# Patient Record
Sex: Female | Born: 2000 | Race: White | Hispanic: No | Marital: Single | Smoking: Never smoker
Health system: Southern US, Community
[De-identification: ages and names within clinical notes are randomized; demographics above are authoritative.]

## PROBLEM LIST (undated history)

## (undated) DIAGNOSIS — F419 Anxiety disorder, unspecified: Secondary | ICD-10-CM

## (undated) DIAGNOSIS — Z789 Other specified health status: Secondary | ICD-10-CM

## (undated) HISTORY — PX: NO PAST SURGERIES: SHX2092

---

## 2019-09-27 DIAGNOSIS — O009 Unspecified ectopic pregnancy without intrauterine pregnancy: Secondary | ICD-10-CM

## 2019-09-27 HISTORY — DX: Unspecified ectopic pregnancy without intrauterine pregnancy: O00.90

## 2020-07-11 ENCOUNTER — Inpatient Hospital Stay (HOSPITAL_COMMUNITY)
Admission: AD | Admit: 2020-07-11 | Discharge: 2020-07-11 | Disposition: A | Discharge status: Home / Self Care | Payer: Medicaid Other | Attending: Family Medicine | Admitting: Family Medicine

## 2020-07-11 ENCOUNTER — Inpatient Hospital Stay (HOSPITAL_COMMUNITY): Payer: Medicaid Other

## 2020-07-11 ENCOUNTER — Encounter (HOSPITAL_COMMUNITY): Payer: Self-pay | Admitting: Family Medicine

## 2020-07-11 ENCOUNTER — Other Ambulatory Visit: Payer: Self-pay

## 2020-07-11 DIAGNOSIS — R300 Dysuria: Secondary | ICD-10-CM | POA: Insufficient documentation

## 2020-07-11 DIAGNOSIS — O009 Unspecified ectopic pregnancy without intrauterine pregnancy: Secondary | ICD-10-CM | POA: Diagnosis not present

## 2020-07-11 DIAGNOSIS — O26891 Other specified pregnancy related conditions, first trimester: Secondary | ICD-10-CM | POA: Insufficient documentation

## 2020-07-11 DIAGNOSIS — Z3A01 Less than 8 weeks gestation of pregnancy: Secondary | ICD-10-CM | POA: Diagnosis not present

## 2020-07-11 DIAGNOSIS — O209 Hemorrhage in early pregnancy, unspecified: Secondary | ICD-10-CM

## 2020-07-11 HISTORY — DX: Other specified health status: Z78.9

## 2020-07-11 LAB — COMPREHENSIVE METABOLIC PANEL
ALT: 31 U/L (ref 0–44)
AST: 28 U/L (ref 15–41)
Albumin: 4.1 g/dL (ref 3.5–5.0)
Alkaline Phosphatase: 86 U/L (ref 38–126)
Anion gap: 10 (ref 5–15)
BUN: 12 mg/dL (ref 6–20)
CO2: 20 mmol/L — ABNORMAL LOW (ref 22–32)
Calcium: 9.3 mg/dL (ref 8.9–10.3)
Chloride: 108 mmol/L (ref 98–111)
Creatinine, Ser: 0.84 mg/dL (ref 0.44–1.00)
GFR, Estimated: >60 mL/min (ref >60)
Glucose, Bld: 82 mg/dL (ref 70–99)
Potassium: 3.6 mmol/L (ref 3.5–5.1)
Sodium: 138 mmol/L (ref 135–145)
Total Bilirubin: 0.9 mg/dL (ref 0.3–1.2)
Total Protein: 7.3 g/dL (ref 6.5–8.1)

## 2020-07-11 LAB — WET PREP, GENITAL
Clue Cells Wet Prep HPF POC: NONE SEEN
Sperm: NONE SEEN
Trich, Wet Prep: NONE SEEN
Yeast Wet Prep HPF POC: NONE SEEN

## 2020-07-11 LAB — CBC WITH DIFFERENTIAL/PLATELET
Abs Immature Granulocytes: 0.03 10*3/uL (ref 0.00–0.07)
Basophils Absolute: 0 10*3/uL (ref 0.0–0.1)
Basophils Relative: 0 %
Eosinophils Absolute: 0.1 10*3/uL (ref 0.0–0.5)
Eosinophils Relative: 1 %
HCT: 44 % (ref 36.0–46.0)
Hemoglobin: 14.6 g/dL (ref 12.0–15.0)
Immature Granulocytes: 0 %
Lymphocytes Relative: 17 %
Lymphs Abs: 1.7 10*3/uL (ref 0.7–4.0)
MCH: 29.1 pg (ref 26.0–34.0)
MCHC: 33.2 g/dL (ref 30.0–36.0)
MCV: 87.8 fL (ref 80.0–100.0)
Monocytes Absolute: 0.8 10*3/uL (ref 0.1–1.0)
Monocytes Relative: 8 %
Neutro Abs: 7.4 10*3/uL (ref 1.7–7.7)
Neutrophils Relative %: 74 %
Platelets: 201 10*3/uL (ref 150–400)
RBC: 5.01 MIL/uL (ref 3.87–5.11)
RDW: 13 % (ref 11.5–15.5)
WBC: 9.9 10*3/uL (ref 4.0–10.5)
nRBC: 0 % (ref 0.0–0.2)

## 2020-07-11 LAB — CBC
HCT: 43.1 % (ref 36.0–46.0)
Hemoglobin: 14.5 g/dL (ref 12.0–15.0)
MCH: 29.5 pg (ref 26.0–34.0)
MCHC: 33.6 g/dL (ref 30.0–36.0)
MCV: 87.6 fL (ref 80.0–100.0)
Platelets: 200 10*3/uL (ref 150–400)
RBC: 4.92 MIL/uL (ref 3.87–5.11)
RDW: 13.1 % (ref 11.5–15.5)
WBC: 9.8 10*3/uL (ref 4.0–10.5)
nRBC: 0 % (ref 0.0–0.2)

## 2020-07-11 LAB — URINALYSIS, ROUTINE W REFLEX MICROSCOPIC
Bilirubin Urine: NEGATIVE
Glucose, UA: NEGATIVE mg/dL
Hgb urine dipstick: NEGATIVE
Ketones, ur: 20 mg/dL — AB
Leukocytes,Ua: NEGATIVE
Nitrite: NEGATIVE
Protein, ur: NEGATIVE mg/dL
Specific Gravity, Urine: 1.025 (ref 1.005–1.030)
pH: 5 (ref 5.0–8.0)

## 2020-07-11 LAB — ABO/RH: ABO/RH(D): O POS

## 2020-07-11 LAB — POCT PREGNANCY, URINE: Preg Test, Ur: POSITIVE — AB

## 2020-07-11 LAB — HCG, QUANTITATIVE, PREGNANCY: hCG, Beta Chain, Quant, S: 3161 m[IU]/mL — ABNORMAL HIGH (ref <5)

## 2020-07-11 MED ORDER — METHOTREXATE FOR ECTOPIC PREGNANCY: 50.0000 mg/m2 | Freq: Once | INTRAMUSCULAR | Status: DC

## 2020-07-11 MED ORDER — METHOTREXATE SODIUM CHEMO INJECTION 250 MG/10ML
50.0000 mg/m2 | Freq: Once | INTRAMUSCULAR | Status: AC
  Administered 2020-07-11: 105 mg via INTRAMUSCULAR
  Filled 2020-07-11: qty 4.2

## 2020-07-11 NOTE — MAU Provider Note (Signed)
History     CSN: [id number]  Arrival date and time: 07/11/20 1358   First Provider Initiated Contact with Patient 07/11/20 1507      Chief Complaint  Patient presents with  . Vaginal Bleeding  . Abdominal Pain  . Dysuria   HPI Amy Lynch is a 19 y.o. G1P0 at [redacted]w[redacted]d who presents with lower abdominal pain and vaginal bleeding. She states she was feeling pressure and urgency so she went to the student health center at [facility] where she found out she was pregnant. Because of the pain, they referred her to MAU. She reports lower abdominal pain that she rates a 3/10 and tried advil for with some relief. She also reports spotting last week but none today. She denies any abnormal discharge. She states she is very surprised and this is not a desired pregnancy.   OB History   No obstetric history on file.     Past Medical History:  Diagnosis Date  . Medical history non-contributory     Past Surgical History:  Procedure Laterality Date  . NO PAST SURGERIES      History reviewed. No pertinent family history.  Social History   Tobacco Use  . Smoking status: Never Smoker  . Smokeless tobacco: Never Used  Vaping Use  . Vaping Use: Some days  Substance Use Topics  . Alcohol use: Not Currently  . Drug use: Never    Allergies:  Allergies  Allergen Reactions  . Penicillins Hives    No medications prior to admission.    Review of Systems  Constitutional: Negative.  Negative for fatigue and fever.  HENT: Negative.   Respiratory: Negative.  Negative for shortness of breath.   Cardiovascular: Negative.  Negative for chest pain.  Gastrointestinal: Positive for abdominal pain. Negative for constipation, diarrhea, nausea and vomiting.  Genitourinary: Positive for vaginal bleeding. Negative for dysuria and vaginal discharge.  Neurological: Negative.  Negative for dizziness and headaches.   Physical Exam   Blood pressure 137/79, pulse (!) 107, temperature 98.4 F (36.9  C), resp. rate 18, weight 103.1 kg, last menstrual period 06/19/2020, SpO2 99 %.  Physical Exam Vitals and nursing note reviewed.  Constitutional:      General: She is not in acute distress.    Appearance: She is well-developed.  HENT:     Head: Normocephalic.  Eyes:     Pupils: Pupils are equal, round, and reactive to light.  Cardiovascular:     Rate and Rhythm: Normal rate and regular rhythm.     Heart sounds: Normal heart sounds.  Pulmonary:     Effort: Pulmonary effort is normal. No respiratory distress.     Breath sounds: Normal breath sounds.  Abdominal:     General: Bowel sounds are normal. There is no distension.     Palpations: Abdomen is soft.     Tenderness: There is no abdominal tenderness.  Skin:    General: Skin is warm and dry.  Neurological:     Mental Status: She is alert and oriented to person, place, and time.  Psychiatric:        Behavior: Behavior normal.        Thought Content: Thought content normal.        Judgment: Judgment normal.     MAU Course  Procedures Results for orders placed or performed during the hospital encounter of 07/11/20 (from the past 24 hour(s))  Urinalysis, Routine w reflex microscopic     Status: Abnormal   Collection Time:  07/11/20  2:38 PM  Result Value Ref Range   Color, Urine YELLOW YELLOW   APPearance CLEAR CLEAR   Specific Gravity, Urine 1.025 1.005 - 1.030   pH 5.0 5.0 - 8.0   Glucose, UA NEGATIVE NEGATIVE mg/dL   Hgb urine dipstick NEGATIVE NEGATIVE   Bilirubin Urine NEGATIVE NEGATIVE   Ketones, ur 20 (A) NEGATIVE mg/dL   Protein, ur NEGATIVE NEGATIVE mg/dL   Nitrite NEGATIVE NEGATIVE   Leukocytes,Ua NEGATIVE NEGATIVE  Pregnancy, urine POC     Status: Abnormal   Collection Time: 07/11/20  2:39 PM  Result Value Ref Range   Preg Test, Ur POSITIVE (A) NEGATIVE  CBC     Status: None   Collection Time: 07/11/20  2:50 PM  Result Value Ref Range   WBC 9.8 4.0 - 10.5 K/uL   RBC 4.92 3.87 - 5.11 MIL/uL    Hemoglobin 14.5 12.0 - 15.0 g/dL   HCT 43.1 36 - 46 %   MCV 87.6 80.0 - 100.0 fL   MCH 29.5 26.0 - 34.0 pg   MCHC 33.6 30.0 - 36.0 g/dL   RDW 13.1 11.5 - 15.5 %   Platelets 200 150 - 400 K/uL   nRBC 0.0 0.0 - 0.2 %  hCG, quantitative, pregnancy     Status: Abnormal   Collection Time: 07/11/20  2:50 PM  Result Value Ref Range   hCG, Beta Chain, Quant, S 3,161 (H) <5 mIU/mL  ABO/Rh     Status: None   Collection Time: 07/11/20  2:50 PM  Result Value Ref Range   ABO/RH(D) O POS    No rh immune globuloin      NOT A RH IMMUNE GLOBULIN CANDIDATE, PT RH POSITIVE Performed at [facility], [address] [place], [place] [address]   Comprehensive metabolic panel     Status: Abnormal   Collection Time: 07/11/20  2:50 PM  Result Value Ref Range   Sodium 138 135 - 145 mmol/L   Potassium 3.6 3.5 - 5.1 mmol/L   Chloride 108 98 - 111 mmol/L   CO2 20 (L) 22 - 32 mmol/L   Glucose, Bld 82 70 - 99 mg/dL   BUN 12 6 - 20 mg/dL   Creatinine, Ser 0.84 0.44 - 1.00 mg/dL   Calcium 9.3 8.9 - 10.3 mg/dL   Total Protein 7.3 6.5 - 8.1 g/dL   Albumin 4.1 3.5 - 5.0 g/dL   AST 28 15 - 41 U/L   ALT 31 0 - 44 U/L   Alkaline Phosphatase 86 38 - 126 U/L   Total Bilirubin 0.9 0.3 - 1.2 mg/dL   GFR, Estimated >60 >60 mL/min   Anion gap 10 5 - 15  CBC with Differential/Platelet     Status: None   Collection Time: 07/11/20  2:50 PM  Result Value Ref Range   WBC 9.9 4.0 - 10.5 K/uL   RBC 5.01 3.87 - 5.11 MIL/uL   Hemoglobin 14.6 12.0 - 15.0 g/dL   HCT 44.0 36 - 46 %   MCV 87.8 80.0 - 100.0 fL   MCH 29.1 26.0 - 34.0 pg   MCHC 33.2 30.0 - 36.0 g/dL   RDW 13.0 11.5 - 15.5 %   Platelets 201 150 - 400 K/uL   nRBC 0.0 0.0 - 0.2 %   Neutrophils Relative % 74 %   Neutro Abs 7.4 1.7 - 7.7 K/uL   Lymphocytes Relative 17 %   Lymphs Abs 1.7 0.7 - 4.0 K/uL  Monocytes Relative 8 %   Monocytes Absolute 0.8 0.1 - 1.0 K/uL   Eosinophils Relative 1 %   Eosinophils Absolute 0.1 0.0 - 0.5 K/uL   Basophils  Relative 0 %   Basophils Absolute 0.0 0.0 - 0.1 K/uL   Immature Granulocytes 0 %   Abs Immature Granulocytes 0.03 0.00 - 0.07 K/uL  Wet prep, genital     Status: Abnormal   Collection Time: 07/11/20  3:35 PM  Result Value Ref Range   Yeast Wet Prep HPF POC NONE SEEN NONE SEEN   Trich, Wet Prep NONE SEEN NONE SEEN   Clue Cells Wet Prep HPF POC NONE SEEN NONE SEEN   WBC, Wet Prep HPF POC MANY (A) NONE SEEN   Sperm NONE SEEN    US OB LESS THAN 14 WEEKS WITH OB TRANSVAGINAL  Result Date: 07/11/2020 CLINICAL DATA:  Pregnant patient with bleeding and cramping. EXAM: OBSTETRIC <14 WK [place] AND TRANSVAGINAL OB US TECHNIQUE: Both transabdominal and transvaginal ultrasound examinations were performed for complete evaluation of the gestation as well as the maternal uterus, adnexal regions, and pelvic cul-de-sac. Transvaginal technique was performed to assess early pregnancy. COMPARISON:  None. FINDINGS: Intrauterine gestational sac: None Maternal uterus/adnexae: There is a moderate amount of simple fluid in the pelvis. The left ovary is normal in appearance with a probable corpus luteum cyst. There is a complicated cystic structure in the right adnexa, likely ovarian in location measuring 5.3 x 3.0 x 5.2 cm. No associated or peripheral blood flow identified. No fetal structure identified in the right adnexa. IMPRESSION: 1. An intrauterine pregnancy is not identified. The lack of an intrauterine pregnancy in the presence of a positive beta HCG could represent early pregnancy, recent miscarriage, or ectopic pregnancy. Recommend close clinical follow-up with follow-up imaging and labs as warranted. 2. There is a moderate amount of simple fluid in the pelvis which is nonspecific in a pregnant patient without a confirmed IUP. However, the fluid does not have the appearance of hemorrhage or clot at this time. 3. The complicated cystic mass in the right adnexa is likely ovarian and probably a functional follicle given  age and size. Neoplasm is statistically less likely. Recommend attention on follow-up. Electronically Signed   By: Dorise Bullion III M.D   On: 07/11/2020 16:09    MDM UA, UPT CBC, HCG ABO/Rh- O Pos Wet prep and gc/chlamydia US OB Comp Less 14 weeks with Transvaginal  Reviewed results with Dr. Kennon Rounds- recommends treatment with MTX due to elevated HCG with suspicious ovarian mass, but if pregnancy is strongly desired, ok to bring patient back for 48hr HCG  Reviewed recommendations with patient and support person. Patient desires MTX management. Risks and benefits reviewed at length and patient verbalized understanding and importance of outpatient follow up.  Differential CMP MTX  Assessment and Plan   1. Ectopic pregnancy without intrauterine pregnancy, unspecified location   2. Vaginal bleeding affecting early pregnancy    -Discharge home in stable condition -Strict ectopic precautions discussed -Patient advised to follow-up with [facility] on Tuesday and Friday for repeat labs, appointments scheduled -Patient may return to MAU as needed or if her condition were to change or worsen    Wende Mott CNM 07/11/2020, 3:07 PM

## 2020-07-11 NOTE — MAU Note (Signed)
Amy Lynch is a 19 y.o. at Unknown here in MAU reporting:  +vaginal spotting  +lower mid abdominal cramping Started intermittent now consistent +UTI s/s--dysuria and frequency  [facility] where she was told that she pregnant. Was told to come here my campus provider for further workup with the pain.  LMP: 06/19/20  Onset of complaint: wednesday Pain score: 2-3/10 Vitals:   07/11/20 1425  BP: 137/79  Pulse: (!) 107  Resp: 18  Temp: 98.4 F (36.9 C)  SpO2: 99%     Lab orders placed from triage: ua and pregnancy

## 2020-07-11 NOTE — Discharge Instructions (Signed)
Ectopic Pregnancy ° °An ectopic pregnancy is when the fertilized egg attaches (implants) outside the uterus. Most ectopic pregnancies occur in one of the tubes where eggs travel from the ovary to the uterus (fallopian tubes), but the implanting can occur in other locations. In rare cases, ectopic pregnancies occur on the ovary, intestine, pelvis, abdomen, or cervix. In an ectopic pregnancy, the fertilized egg does not have the ability to develop into a normal, healthy baby. °A ruptured ectopic pregnancy is one in which tearing or bursting of a fallopian tube causes internal bleeding. Often, there is intense lower abdominal pain, and vaginal bleeding sometimes occurs. Having an ectopic pregnancy can be life-threatening. If this dangerous condition is not treated, it can lead to blood loss, shock, or even death. °What are the causes? °The most common cause of this condition is damage to one of the fallopian tubes. A fallopian tube may be narrowed or blocked, and that keeps the fertilized egg from reaching the uterus. °What increases the risk? °This condition is more likely to develop in women of childbearing age who have different levels of risk. The levels of risk can be divided into three categories. °High risk °· You have gone through infertility treatment. °· You have had an ectopic pregnancy before. °· You have had surgery on the fallopian tubes, or another surgical procedure, such as an abortion. °· You have had surgery to have the fallopian tubes tied (tubal ligation). °· You have problems or diseases of the fallopian tubes. °· You have been exposed to diethylstilbestrol (DES). This medicine was used until 1971, and it had effects on babies whose mothers took the medicine. °· You become pregnant while using an IUD (intrauterine device) for birth control. °Moderate risk °· You have a history of infertility. °· You have had an STI (sexually transmitted infection). °· You have a history of pelvic inflammatory  disease (PID). °· You have scarring from endometriosis. °· You have multiple sexual partners. °· You smoke. °Low risk °· You have had pelvic surgery. °· You use vaginal douches. °· You became sexually active before age 18. °What are the signs or symptoms? °Common symptoms of this condition include normal pregnancy symptoms, such as missing a period, nausea, tiredness, abdominal pain, breast tenderness, and bleeding. However, ectopic pregnancy will have additional symptoms, such as: °· Pain with intercourse. °· Irregular vaginal bleeding or spotting. °· Cramping or pain on one side or in the lower abdomen. °· Fast heartbeat, low blood pressure, and sweating. °· Passing out while having a bowel movement. °Symptoms of a ruptured ectopic pregnancy and internal bleeding may include: °· Sudden, severe pain in the abdomen and pelvis. °· Dizziness, weakness, light-headedness, or fainting. °· Pain in the shoulder or neck area. °How is this diagnosed? °This condition is diagnosed by: °· A pelvic exam to locate pain or a mass in the abdomen. °· A pregnancy test. This blood test checks for the presence as well as the specific level of pregnancy hormone in the bloodstream. °· Ultrasound. This is performed if a pregnancy test is positive. In this test, a probe is inserted into the vagina. The probe will detect a fetus, possibly in a location other than the uterus. °· Taking a sample of uterus tissue (dilation and curettage, or D&C). °· Surgery to perform a visual exam of the inside of the abdomen using a thin, lighted tube that has a tiny camera on the end (laparoscope). °· Culdocentesis. This procedure involves inserting a needle at the top of   the vagina, behind the uterus. If blood is present in this area, it may indicate that a fallopian tube is torn. How is this treated? This condition is treated with medicine or surgery. Medicine  An injection of a medicine (methotrexate) may be given to cause the pregnancy tissue to be  absorbed. This medicine may save your fallopian tube. It may be given if: ? The diagnosis is made early, with no signs of active bleeding. ? The fallopian tube has not ruptured. ? You are considered to be a good candidate for the medicine. Usually, pregnancy hormone blood levels are checked after methotrexate treatment. This is to be sure that the medicine is effective. It may take 4-6 weeks for the pregnancy to be absorbed. Most pregnancies will be absorbed by 3 weeks. Surgery  A laparoscope may be used to remove the pregnancy tissue.  If severe internal bleeding occurs, a larger cut (incision) may be made in the lower abdomen (laparotomy) to remove the fetus and placenta. This is done to stop the bleeding.  Part or all of the fallopian tube may be removed (salpingectomy) along with the fetus and placenta. The fallopian tube may also be repaired during the surgery.  In very rare circumstances, removal of the uterus (hysterectomy) may be required.  After surgery, pregnancy hormone testing may be done to be sure that there is no pregnancy tissue left. Whether your treatment is medicine or surgery, you may receive a Rho (D) immune globulin shot to prevent problems with any future pregnancy. This shot may be given if:  You are Rh-negative and the baby's father is Rh-positive.  You are Rh-negative and you do not know the Rh type of the baby's father. Follow these instructions at home:  Rest and limit your activity after the procedure for as long as told by your health care provider.  Until your health care provider says that it is safe: ? Do not lift anything that is heavier than 10 lb (4.5 kg), or the limit that your health care provider tells you. ? Avoid physical exercise and any movement that requires effort (is strenuous).  To help prevent constipation: ? Eat a healthy diet that includes fruits, vegetables, and whole grains. ? Drink 6-8 glasses of water per day. Get help right away  if:  You develop worsening pain that is not relieved by medicine.  You have: ? A fever or chills. ? Vaginal bleeding. ? Redness and swelling at the incision site. ? Nausea and vomiting.  You feel dizzy or weak.  You feel light-headed or you faint. This information is not intended to replace advice given to you by your health care provider. Make sure you discuss any questions you have with your health care provider. Document Revised: 08/25/2017 Document Reviewed: 04/13/2016 Elsevier Patient Education  [place].   Methotrexate Treatment for an Ectopic Pregnancy  Methotrexate is a medicine that treats an ectopic pregnancy. An ectopic pregnancy is a pregnancy in which the fetus develops outside the uterus. This kind of pregnancy can be dangerous. Methotrexate works by stopping the growth of the fertilized egg. It also helps your body absorb tissue from the egg. This takes between 2-6 weeks. Most ectopic pregnancies can be successfully treated with methotrexate if they are diagnosed early. Tell a health care provider about:  Any allergies you have.  All medicines you are taking, including vitamins, herbs, eye drops, creams, and over-the-counter medicines.  Any medical conditions you have. What are the risks? Generally, this is  a safe treatment. However, problems may occur, including:  Nausea or vomiting or both.  Vaginal bleeding or spotting.  Diarrhea.  Abdominal cramping.  Dizziness or feeling lightheaded.  Mouth sores.  Swelling or irritation of the lining of your lungs (pneumonitis).  Liver damage.  Hair loss. There is a risk that methotrexate treatment will fail and your pregnancy will continue. There is also a risk that the ectopic pregnancy might rupture while you are using this medicine. What happens before the procedure?  Liver tests, kidney tests, and a complete blood test will be done.  Blood tests will be done to measure the pregnancy hormone  levels and to determine your blood type.  If you are Rh-negative and the father is Rh-positive or his Rh type is not known, you will be given a Rho (D) immune globulin shot. What happens during the procedure? Your health care provider may give you methotrexate by injection or in the form of a pill. Methotrexate may be given as a single dose of medicine or a series of doses, depending on your response to the treatment.  Methotrexate injections will be given by your health care provider. This is the most common way that methotrexate is used to treat an ectopic pregnancy.  If you are prescribed oral methotrexate, it is very important that you follow your health care provider's instructions on how to take oral methotrexate. Additional medicines may be needed to manage an ectopic pregnancy. The procedure may vary among health care providers and hospitals. What happens after the procedure?  You may have abdominal cramping, vaginal bleeding, and fatigue.  Blood tests will be taken at timed intervals for several days or weeks to check your pregnancy hormone levels. The blood tests will be done until the pregnancy hormone can no longer be detected in the blood.  You may need to have a surgical procedure to remove the ectopic pregnancy if methotrexate treatment fails.  Follow instructions from your health care provider on how and when to report any symptoms that may indicate a ruptured ectopic pregnancy. Summary  Methotrexate is a medicine that treats an ectopic pregnancy.  Methotrexate may be given in a single dose or a series of doses over time.  Blood tests will be taken at timed intervals for several days or weeks to check your pregnancy hormone levels. The blood tests will be done until no more pregnancy hormone is detected in the blood.  There is a risk that methotrexate treatment will fail and your pregnancy will continue. There is also a risk that the ectopic pregnancy might rupture while  you are using this medicine. This information is not intended to replace advice given to you by your health care provider. Make sure you discuss any questions you have with your health care provider. Document Revised: 08/25/2017 Document Reviewed: 11/01/2016 Elsevier Patient Education  [place].

## 2020-07-13 LAB — GC/CHLAMYDIA PROBE AMP (~~LOC~~) NOT AT ARMC
Chlamydia: NEGATIVE
Comment: NEGATIVE
Comment: NORMAL
Neisseria Gonorrhea: NEGATIVE

## 2020-07-14 ENCOUNTER — Other Ambulatory Visit: Payer: Self-pay

## 2020-07-14 ENCOUNTER — Ambulatory Visit (INDEPENDENT_AMBULATORY_CARE_PROVIDER_SITE_OTHER): Payer: Medicaid Other

## 2020-07-14 VITALS — BP 126/87 | HR 106 | Wt 227.5 lb

## 2020-07-14 DIAGNOSIS — O009 Unspecified ectopic pregnancy without intrauterine pregnancy: Secondary | ICD-10-CM | POA: Diagnosis not present

## 2020-07-14 LAB — BETA HCG QUANT (REF LAB): hCG Quant: 791 m[IU]/mL

## 2020-07-14 NOTE — Progress Notes (Signed)
Patient was assessed and managed by nursing staff during this encounter. I have reviewed the chart and agree with the documentation and plan. I have also made any necessary editorial changes.  Mora Bellman, MD 07/14/2020 3:34 PM

## 2020-07-14 NOTE — Progress Notes (Signed)
Amy Lynch presents to [facility] for follow-up MTX day 4 beta HCG today. She was seen in MAU for abdominal pain and bleeding on 07/11/20. Pt treated for likely ectopic pregnancy with MTX at MAU. Patient endorses right sided pelvic pain today that is 1/10 on pain scale. Denies any vaginal bleeding since visit to MAU. Discussed with patient, we are following hCG levels today. Results will be back in approximately 2 hours. Valid contact number for patient confirmed. I will call the patient with results.  Lab drawn.  Beta HCG today is 791, this has decreased from 3161 on 07/11/20. Results and patient history reviewed with Constant, MD who states pt should return on Friday AM for day 7 beta HCG level. Patient called and informed of plan for follow-up. Ectopic precautions reviewed. Explained pt may continue heating bad and warm shower for cramping. Explained pt may add Tylenol but should avoid other pain medications. Pt will follow up at appt on 07/17/20 at 0830.  Amy Lynch 07/14/2020 8:56 AM

## 2020-07-15 ENCOUNTER — Inpatient Hospital Stay (HOSPITAL_BASED_OUTPATIENT_CLINIC_OR_DEPARTMENT_OTHER): Payer: Medicaid Other

## 2020-07-15 ENCOUNTER — Inpatient Hospital Stay (HOSPITAL_COMMUNITY)
Admission: AD | Admit: 2020-07-15 | Discharge: 2020-07-15 | Disposition: A | Discharge status: Home / Self Care | Payer: Medicaid Other | Attending: Obstetrics & Gynecology | Admitting: Obstetrics & Gynecology

## 2020-07-15 ENCOUNTER — Other Ambulatory Visit: Payer: Self-pay

## 2020-07-15 DIAGNOSIS — N839 Noninflammatory disorder of ovary, fallopian tube and broad ligament, unspecified: Secondary | ICD-10-CM | POA: Diagnosis not present

## 2020-07-15 DIAGNOSIS — O26899 Other specified pregnancy related conditions, unspecified trimester: Secondary | ICD-10-CM

## 2020-07-15 DIAGNOSIS — R109 Unspecified abdominal pain: Secondary | ICD-10-CM | POA: Diagnosis not present

## 2020-07-15 DIAGNOSIS — O26891 Other specified pregnancy related conditions, first trimester: Secondary | ICD-10-CM | POA: Diagnosis not present

## 2020-07-15 DIAGNOSIS — Z3A01 Less than 8 weeks gestation of pregnancy: Secondary | ICD-10-CM | POA: Insufficient documentation

## 2020-07-15 DIAGNOSIS — N838 Other noninflammatory disorders of ovary, fallopian tube and broad ligament: Secondary | ICD-10-CM

## 2020-07-15 DIAGNOSIS — N83201 Unspecified ovarian cyst, right side: Secondary | ICD-10-CM | POA: Diagnosis not present

## 2020-07-15 DIAGNOSIS — Z5181 Encounter for therapeutic drug level monitoring: Secondary | ICD-10-CM | POA: Diagnosis not present

## 2020-07-15 DIAGNOSIS — Z79631 Long term (current) use of antimetabolite agent: Secondary | ICD-10-CM

## 2020-07-15 DIAGNOSIS — Z79899 Other long term (current) drug therapy: Secondary | ICD-10-CM

## 2020-07-15 LAB — CBC WITH DIFFERENTIAL/PLATELET
Abs Immature Granulocytes: 0.04 10*3/uL (ref 0.00–0.07)
Basophils Absolute: 0 10*3/uL (ref 0.0–0.1)
Basophils Relative: 0 %
Eosinophils Absolute: 0 10*3/uL (ref 0.0–0.5)
Eosinophils Relative: 0 %
HCT: 41.9 % (ref 36.0–46.0)
Hemoglobin: 14.2 g/dL (ref 12.0–15.0)
Immature Granulocytes: 0 %
Lymphocytes Relative: 14 %
Lymphs Abs: 1.2 10*3/uL (ref 0.7–4.0)
MCH: 30 pg (ref 26.0–34.0)
MCHC: 33.9 g/dL (ref 30.0–36.0)
MCV: 88.6 fL (ref 80.0–100.0)
Monocytes Absolute: 0.4 10*3/uL (ref 0.1–1.0)
Monocytes Relative: 5 %
Neutro Abs: 7.3 10*3/uL (ref 1.7–7.7)
Neutrophils Relative %: 81 %
Platelets: 228 10*3/uL (ref 150–400)
RBC: 4.73 MIL/uL (ref 3.87–5.11)
RDW: 12.6 % (ref 11.5–15.5)
WBC: 9.1 10*3/uL (ref 4.0–10.5)
nRBC: 0 % (ref 0.0–0.2)

## 2020-07-15 LAB — TYPE AND SCREEN
ABO/RH(D): O POS
Antibody Screen: NEGATIVE

## 2020-07-15 LAB — HCG, QUANTITATIVE, PREGNANCY: hCG, Beta Chain, Quant, S: 335 m[IU]/mL — ABNORMAL HIGH (ref <5)

## 2020-07-15 NOTE — MAU Note (Signed)
Was here on Sat, dx and being treated as ectopic preg.  Has not had any bleeding.  Started having bad cramps last night  All over abd, started feeling sick, so was told to come in and make sure nothing else is going on.

## 2020-07-15 NOTE — MAU Provider Note (Signed)
History     CSN: [id number]  Arrival date and time: 07/15/20 1351   First Provider Initiated Contact with Patient 07/15/20 1455      Chief Complaint  Patient presents with  . Abdominal Pain   HPI  Ms.Amy Lynch is a 19 y.o. female G1P0 @ [redacted]w[redacted]d here with abdominal pain. She is currently being monitored for an ectopic pregnancy which was diagnosed on 10/16 in MAU. She accepted treatment with MTX.    The pain started yesterday. The pain happened 1x yesterday and 1x today. At the time of pain onset the patient was in tears d/t the pain. The pain has resolved. She has not taken any medication for the pain. She has no bleeding.  She was seen on 10/19 for a Quant which showed her levels have come down from 791 from 3161.   OB History    Gravida  1   Para      Term      Preterm      AB      Living        SAB      TAB      Ectopic      Multiple      Live Births              Past Medical History:  Diagnosis Date  . Medical history non-contributory     Past Surgical History:  Procedure Laterality Date  . NO PAST SURGERIES      No family history on file.  Social History   Tobacco Use  . Smoking status: Never Smoker  . Smokeless tobacco: Never Used  Vaping Use  . Vaping Use: Some days  Substance Use Topics  . Alcohol use: Not Currently  . Drug use: Never    Allergies:  Allergies  Allergen Reactions  . Penicillins Hives    No medications prior to admission.   Recent Results (from the past 2160 hour(s))  Urinalysis, Routine w reflex microscopic     Status: Abnormal   Collection Time: 07/11/20  2:38 PM  Result Value Ref Range   Color, Urine YELLOW YELLOW   APPearance CLEAR CLEAR   Specific Gravity, Urine 1.025 1.005 - 1.030   pH 5.0 5.0 - 8.0   Glucose, UA NEGATIVE NEGATIVE mg/dL   Hgb urine dipstick NEGATIVE NEGATIVE   Bilirubin Urine NEGATIVE NEGATIVE   Ketones, ur 20 (A) NEGATIVE mg/dL   Protein, ur NEGATIVE NEGATIVE mg/dL    Nitrite NEGATIVE NEGATIVE   Leukocytes,Ua NEGATIVE NEGATIVE    Comment: Performed at [facility] [address], [place], [place] [address]  Pregnancy, urine POC     Status: Abnormal   Collection Time: 07/11/20  2:39 PM  Result Value Ref Range   Preg Test, Ur POSITIVE (A) NEGATIVE    Comment:        THE SENSITIVITY OF THIS METHODOLOGY IS >24 mIU/mL   GC/Chlamydia probe amp ([place])not at [facility]     Status: None   Collection Time: 07/11/20  2:40 PM  Result Value Ref Range   Chlamydia Negative    Neisseria Gonorrhea Negative    Comment Normal Reference [place] Chlamydia - Negative    Comment      Normal Reference Range Neisseria Gonorrhea - Negative  CBC     Status: None   Collection Time: 07/11/20  2:50 PM  Result Value Ref Range   WBC 9.8 4.0 - 10.5 K/uL   RBC 4.92  3.87 - 5.11 MIL/uL   Hemoglobin 14.5 12.0 - 15.0 g/dL   HCT 43.1 36 - 46 %   MCV 87.6 80.0 - 100.0 fL   MCH 29.5 26.0 - 34.0 pg   MCHC 33.6 30.0 - 36.0 g/dL   RDW 13.1 11.5 - 15.5 %   Platelets 200 150 - 400 K/uL   nRBC 0.0 0.0 - 0.2 %    Comment: Performed at [facility], [place] [address], [place], [place] [address]  hCG, quantitative, pregnancy     Status: Abnormal   Collection Time: 07/11/20  2:50 PM  Result Value Ref Range   hCG, Beta Chain, Quant, S 3,161 (H) <5 mIU/mL    Comment:          GEST. AGE      CONC.  (mIU/mL)   <=1 WEEK        5 - 50     2 WEEKS       50 - 500     3 WEEKS       100 - 10,000     4 WEEKS     1,000 - 30,000     5 WEEKS     3,500 - 115,000   6-8 WEEKS     12,000 - 270,000    12 WEEKS     15,000 - 220,000        FEMALE AND NON-PREGNANT FEMALE:     LESS THAN 5 mIU/mL Performed at [facility], [place] [address] [place], [place] [address]   ABO/Rh     Status: None   Collection Time: 07/11/20  2:50 PM  Result Value Ref Range   ABO/RH(D) O POS    No rh immune globuloin      NOT A RH IMMUNE GLOBULIN CANDIDATE, PT RH POSITIVE Performed at [facility] [address], [place], [place] [address]   Comprehensive metabolic panel     Status: Abnormal   Collection Time: 07/11/20  2:50 PM  Result Value Ref Range   Sodium 138 135 - 145 mmol/L   Potassium 3.6 3.5 - 5.1 mmol/L   Chloride 108 98 - 111 mmol/L   CO2 20 (L) 22 - 32 mmol/L   Glucose, Bld 82 70 - 99 mg/dL    Comment: Glucose reference range applies only to samples taken after fasting for at least 8 hours.   BUN 12 6 - 20 mg/dL   Creatinine, Ser 0.84 0.44 - 1.00 mg/dL   Calcium 9.3 8.9 - 10.3 mg/dL   Total Protein 7.3 6.5 - 8.1 g/dL   Albumin 4.1 3.5 - 5.0 g/dL   AST 28 15 - 41 U/L   ALT 31 0 - 44 U/L   Alkaline Phosphatase 86 38 - 126 U/L   Total Bilirubin 0.9 0.3 - 1.2 mg/dL   GFR, Estimated >60 >60 mL/min   Anion gap 10 5 - 15    Comment: Performed at [facility] [address], [place], [place] [address]  CBC with Differential/Platelet     Status: None   Collection Time: 07/11/20  2:50 PM  Result Value Ref Range   WBC 9.9 4.0 - 10.5 K/uL   RBC 5.01 3.87 - 5.11 MIL/uL   Hemoglobin 14.6 12.0 - 15.0 g/dL   HCT 44.0 36 - 46 %   MCV 87.8 80.0 - 100.0 fL   MCH 29.1 26.0 - 34.0 pg   MCHC 33.2 30.0 - 36.0  g/dL   RDW 13.0 11.5 - 15.5 %   Platelets 201 150 - 400 K/uL   nRBC 0.0 0.0 - 0.2 %   Neutrophils Relative % 74 %   Neutro Abs 7.4 1.7 - 7.7 K/uL   Lymphocytes Relative 17 %   Lymphs Abs 1.7 0.7 - 4.0 K/uL   Monocytes Relative 8 %   Monocytes Absolute 0.8 0.1 - 1.0 K/uL   Eosinophils Relative 1 %   Eosinophils Absolute 0.1 0.0 - 0.5 K/uL   Basophils Relative 0 %   Basophils Absolute 0.0 0.0 - 0.1 K/uL   Immature Granulocytes 0 %   Abs Immature Granulocytes 0.03 0.00 - 0.07 K/uL    Comment: Performed at [facility] [address], [place], [place] [address]  Wet prep, genital     Status: Abnormal   Collection Time: 07/11/20  3:35 PM  Result Value Ref Range   Yeast Wet Prep HPF POC NONE SEEN NONE SEEN   Trich, Wet Prep NONE SEEN NONE SEEN   Clue  Cells Wet Prep HPF POC NONE SEEN NONE SEEN   WBC, Wet Prep HPF POC MANY (A) NONE SEEN   Sperm NONE SEEN     Comment: Performed at [facility], [place] [address], [place], [place] [address]  Beta hCG quant (ref lab)     Status: None   Collection Time: 07/14/20  9:23 AM  Result Value Ref Range   hCG Quant 791 mIU/mL    Comment:                      Female (Non-pregnant)    0 -     5                             (Postmenopausal)  0 -     8                      Female (Pregnant)                      Weeks of Gestation                              3                6 -    71                              4               10 -   750                              5              217 -  7138                              6              158 - 317[address]                              7  [place] CLIA methodology   Type and screen [place]     Status: None   Collection Time: 07/15/20  3:09 PM  Result Value Ref Range   ABO/RH(D) O POS    Antibody Screen NEG    Sample Expiration      07/18/2020,2359 Performed at [facility], [place] [address] [place], [place] [address]   CBC with Differential/Platelet     Status: None   Collection Time: 07/15/20  3:33 PM  Result Value Ref Range   WBC 9.1 4.0 - 10.5 K/uL   RBC 4.73 3.87 - 5.11 MIL/uL   Hemoglobin 14.2 12.0 - 15.0 g/dL   HCT 41.9 36 - 46 %   MCV 88.6 80.0 - 100.0 fL   MCH 30.0 26.0 - 34.0 pg   MCHC 33.9  30.0 - 36.0 g/dL   RDW 12.6 11.5 - 15.5 %   Platelets 228 150 - 400 K/uL   nRBC 0.0 0.0 - 0.2 %   Neutrophils Relative % 81 %   Neutro Abs 7.3 1.7 - 7.7 K/uL   Lymphocytes Relative 14 %   Lymphs Abs 1.2 0.7 - 4.0 K/uL   Monocytes Relative 5 %  Monocytes Absolute 0.4 0.1 - 1.0 K/uL   Eosinophils Relative 0 %   Eosinophils Absolute 0.0 0.0 - 0.5 K/uL   Basophils Relative 0 %   Basophils Absolute 0.0 0.0 - 0.1 K/uL   Immature Granulocytes 0 %   Abs Immature Granulocytes 0.04 0.00 - 0.07 K/uL    Comment: Performed at [facility], [place] [address] [place], [place] [address]  hCG, quantitative, pregnancy     Status: Abnormal   Collection Time: 07/15/20  3:33 PM  Result Value Ref Range   hCG, Beta Chain, Quant, S 335 (H) <5 mIU/mL    Comment:          GEST. AGE      CONC.  (mIU/mL)   <=1 WEEK        5 - 50     2 WEEKS       50 - 500     3 WEEKS       100 - 10,000     4 WEEKS     1,000 - 30,000     5 WEEKS     3,500 - 115,000   6-8 WEEKS     12,000 - 270,000    12 WEEKS     15,000 - 220,000        FEMALE AND NON-PREGNANT FEMALE:     LESS THAN 5 mIU/mL Performed at [facility], [place] [address] [place], [place] [address]   Beta hCG quant (ref lab)     Status: None   Collection Time: 07/17/20  8:39 AM  Result Value Ref Range   hCG Quant 176 mIU/mL    Comment:                      Female (Non-pregnant)    0 -     5                             (Postmenopausal)  0 -     8                      Female (Pregnant)                      Weeks of Gestation                              3                6 -    71                              4               10 -   750                              5              217 -  7138                              6              158 - [phone]  7             [id number] -[id number]                              [place] CLIA methodology    Review of Systems  Constitutional: Negative for fever.  Gastrointestinal: Positive for abdominal pain. Negative for nausea and vomiting.  Genitourinary: Negative for vaginal bleeding and vaginal discharge.   Physical Exam   Blood pressure 124/69, pulse 88, temperature 98.7 F (37.1 C), temperature source Oral, resp. rate 18, height 5\' 2"  (1.575 m), weight 102.1 kg, last menstrual period 06/19/2020, SpO2 98 %.  Physical Exam Constitutional:      Appearance: She is well-developed and normal weight. She is not ill-appearing.  Abdominal:     Palpations: Abdomen is soft.     Tenderness: There is abdominal tenderness in the right lower quadrant and suprapubic area. There is rebound. There is no guarding.  Skin:    General: Skin is warm.  Neurological:     Mental Status: She is alert and oriented to person, place, and time.  Psychiatric:        Behavior: Behavior normal.    MAU Course  Procedures  None   MDM  [place] ordered hcg down to 335 Spoke With Dr. Roselie Awkward who came down to MAU to see patient;  he reviewed [place]. [place] for DC home.   Assessment and Plan   A:  1. Encounter for monitoring of methotrexate therapy   2. Abdominal cramping affecting pregnancy   3. Ovarian mass, right   4. Abdominal pain affecting pregnancy      P:  Discharge home in stable condition Keep your Hcg level in the office as scheduled Strict return precautions Ectopic precautions  Amy Lynch, Artist Pais, NP 07/18/2020 8:55 PM

## 2020-07-17 ENCOUNTER — Other Ambulatory Visit (HOSPITAL_COMMUNITY)
Admission: RE | Admit: 2020-07-17 | Discharge: 2020-07-17 | Disposition: A | Discharge status: Home / Self Care | Payer: Medicaid Other | Source: Ambulatory Visit | Attending: Family Medicine | Admitting: Family Medicine

## 2020-07-17 ENCOUNTER — Ambulatory Visit (INDEPENDENT_AMBULATORY_CARE_PROVIDER_SITE_OTHER): Payer: Medicaid Other | Admitting: *Deleted

## 2020-07-17 ENCOUNTER — Other Ambulatory Visit: Payer: Self-pay

## 2020-07-17 VITALS — BP 114/78 | HR 78 | Ht 62.0 in | Wt 227.5 lb

## 2020-07-17 DIAGNOSIS — N898 Other specified noninflammatory disorders of vagina: Secondary | ICD-10-CM

## 2020-07-17 DIAGNOSIS — O009 Unspecified ectopic pregnancy without intrauterine pregnancy: Secondary | ICD-10-CM | POA: Diagnosis not present

## 2020-07-17 LAB — BETA HCG QUANT (REF LAB): hCG Quant: 176 m[IU]/mL

## 2020-07-17 NOTE — Progress Notes (Signed)
Here for stat bhcg. Denies pain. Denies bleeding. Explained we will draw stat bhcg and have her leave office. She will be called with results in about 2 hours after results received and reviewed by provider.  She voices understanding.  She also c/o vaginal itching and having clear vaginal discharge. Will do self swab for yeast only - she states recently tested for std's in MAU and negative. Explained will be contacted if needs treatment. Also asked about birth control. Explained she needs to have her bhcg levels returned to normal first and we will check those weekly until return to normal. Advised use condoms until she gets birth control. Advised to make appointment for birth control with [place] or provider of her choice for 2 weeks or later. She voices understanding. Yosgar Demirjian,RN

## 2020-07-20 ENCOUNTER — Encounter: Payer: Self-pay | Admitting: *Deleted

## 2020-07-20 LAB — CERVICOVAGINAL ANCILLARY ONLY
Candida Glabrata: NEGATIVE
Candida Vaginitis: NEGATIVE
Comment: NEGATIVE
Comment: NEGATIVE

## 2020-07-20 NOTE — Progress Notes (Signed)
Addendum:  07/20/20 0820am. For 07/18/20 12:00 noon stat bhcg results not available. Office closing , Diane Day,RN will fu with on call physician. Per chart review resulted and reviewed by Dr. Roselie Awkward and he sent Mychart message to K[facility] which she has reviewed. Will need follow up non stat bhcg this week with her already scheduled appointment. Krystiana Fornes,RN

## 2020-07-20 NOTE — Progress Notes (Signed)
Late entry:  10/22  1255  Dr. Kennon Rounds notified that results from Stat BHCG have not yet been received and office is now closed. She voiced understanding and stated that she will check the results later today and contact the pt.

## 2020-07-23 ENCOUNTER — Other Ambulatory Visit: Payer: Self-pay

## 2020-07-23 ENCOUNTER — Encounter: Payer: Self-pay | Admitting: Obstetrics & Gynecology

## 2020-07-23 ENCOUNTER — Ambulatory Visit (INDEPENDENT_AMBULATORY_CARE_PROVIDER_SITE_OTHER): Payer: Medicaid Other | Admitting: Obstetrics & Gynecology

## 2020-07-23 VITALS — BP 117/78 | HR 97 | Ht 62.0 in | Wt 229.7 lb

## 2020-07-23 DIAGNOSIS — O009 Unspecified ectopic pregnancy without intrauterine pregnancy: Secondary | ICD-10-CM

## 2020-07-23 NOTE — Progress Notes (Signed)
Amy Lynch is an 19 y.o. female. G1 s/p MTX for irregular quant HCGs.  Last quant 176 <- 791.  Pt denies abd pain, vaginial bleeding, discussed contraception today.  Offered LARCs, pt declined desires OCPs. No complaints otherwise  Pertinent Gynecological History: Menses: flow is moderate Bleeding: intermenstrual bleeding and dysfunctional uterine bleeding Contraception: OCP (estrogen/progesterone) DES exposure: denies OB History: G1, P0010   Menstrual History: Menarche age: 61 Patient's last menstrual period was 06/19/2020.    Past Medical History:  Diagnosis Date  . Medical history non-contributory     Past Surgical History:  Procedure Laterality Date  . NO PAST SURGERIES      No family history on file.  Social History:  reports that she has never smoked. She has never used smokeless tobacco. She reports previous alcohol use. She reports that she does not use drugs.  Allergies:  Allergies  Allergen Reactions  . Penicillins Hives   Review of Systems  Constitutional: Negative for activity change, appetite change, fatigue and unexpected weight change.  HENT: Negative.   Eyes: Negative.   Respiratory: Negative.  Negative for chest tightness, shortness of breath and wheezing.   Cardiovascular: Negative.  Negative for chest pain and leg swelling.  Gastrointestinal: Negative.  Negative for abdominal distention, abdominal pain, constipation, diarrhea, nausea and vomiting.  Endocrine: Negative.   Genitourinary: Positive for menstrual problem and vaginal bleeding. Negative for dysuria and hematuria.  Neurological: Negative.  Negative for dizziness, weakness, light-headedness, numbness and headaches.  Hematological: Negative.   Psychiatric/Behavioral: Negative.  Negative for agitation, confusion and decreased concentration.    Blood pressure 117/78, pulse 97, height 5\' 2"  (1.575 m), weight 229 lb 11.2 oz (104.2 kg), last menstrual period 06/19/2020, unknown if currently  breastfeeding. Physical Exam Vitals reviewed.  Constitutional:      Appearance: She is well-developed.  HENT:     Head: Normocephalic and atraumatic.  Eyes:     Pupils: Pupils are equal, round, and reactive to light.  Cardiovascular:     Rate and Rhythm: Normal rate.  Pulmonary:     Effort: Pulmonary effort is normal.  Abdominal:     General: There is no distension.     Palpations: Abdomen is soft.     Tenderness: There is no abdominal tenderness. There is no guarding.  Musculoskeletal:     Cervical back: Normal range of motion.  Skin:    General: Skin is warm and dry.  Neurological:     Mental Status: She is alert and oriented to person, place, and time.  Psychiatric:        Behavior: Behavior normal.    Assessment/Plan: 19yo G1 w/ irregular quant HCGs. S/p MTX with appropriate fall.  Pt desires to start OCPs. No contraindications. Continue weekly quants. Bleeding precautions given.   Cherre Blanc 07/23/2020, 3:19 PM

## 2020-07-23 NOTE — Progress Notes (Signed)
n

## 2020-07-24 LAB — BETA HCG QUANT (REF LAB): hCG Quant: 34 m[IU]/mL

## 2020-07-26 ENCOUNTER — Encounter: Payer: Self-pay | Admitting: Obstetrics & Gynecology

## 2020-07-29 ENCOUNTER — Other Ambulatory Visit: Payer: Self-pay

## 2020-07-29 DIAGNOSIS — O009 Unspecified ectopic pregnancy without intrauterine pregnancy: Secondary | ICD-10-CM

## 2020-07-30 ENCOUNTER — Other Ambulatory Visit: Payer: Self-pay

## 2020-07-30 ENCOUNTER — Other Ambulatory Visit: Payer: Medicaid Other

## 2020-07-30 DIAGNOSIS — O009 Unspecified ectopic pregnancy without intrauterine pregnancy: Secondary | ICD-10-CM

## 2020-07-31 LAB — BETA HCG QUANT (REF LAB): hCG Quant: 3 m[IU]/mL

## 2020-08-03 ENCOUNTER — Telehealth: Payer: Self-pay

## 2020-08-03 ENCOUNTER — Other Ambulatory Visit: Payer: Self-pay | Admitting: Family Medicine

## 2020-08-03 DIAGNOSIS — Z30011 Encounter for initial prescription of contraceptive pills: Secondary | ICD-10-CM

## 2020-08-03 MED ORDER — NORETHIN ACE-ETH ESTRAD-FE 1-20 MG-MCG(24) PO TABS: 1.0000 | ORAL_TABLET | Freq: Every day | ORAL | 11 refills | Status: DC

## 2020-08-03 NOTE — Telephone Encounter (Signed)
Opened for Error

## 2020-08-03 NOTE — Progress Notes (Signed)
Patient is s/p MXT and desire contraceptive method with low dose of hormone.   Rx for Lo-estin sent to pharmacy on file.

## 2020-10-23 ENCOUNTER — Emergency Department (HOSPITAL_COMMUNITY): Payer: Medicaid Other

## 2020-10-23 ENCOUNTER — Encounter: Payer: Self-pay | Admitting: Emergency Medicine

## 2020-10-23 ENCOUNTER — Other Ambulatory Visit: Payer: Self-pay

## 2020-10-23 ENCOUNTER — Emergency Department (HOSPITAL_COMMUNITY)
Admission: EM | Admit: 2020-10-23 | Discharge: 2020-10-23 | Disposition: A | Discharge status: Home / Self Care | Payer: Medicaid Other | Attending: Emergency Medicine | Admitting: Emergency Medicine

## 2020-10-23 ENCOUNTER — Ambulatory Visit: Payer: Self-pay

## 2020-10-23 ENCOUNTER — Encounter (HOSPITAL_COMMUNITY): Payer: Self-pay

## 2020-10-23 ENCOUNTER — Ambulatory Visit
Admission: EM | Admit: 2020-10-23 | Discharge: 2020-10-23 | Disposition: A | Discharge status: Home / Self Care | Payer: Medicaid Other

## 2020-10-23 DIAGNOSIS — N83201 Unspecified ovarian cyst, right side: Secondary | ICD-10-CM | POA: Insufficient documentation

## 2020-10-23 DIAGNOSIS — R102 Pelvic and perineal pain: Secondary | ICD-10-CM

## 2020-10-23 DIAGNOSIS — N83202 Unspecified ovarian cyst, left side: Secondary | ICD-10-CM | POA: Diagnosis not present

## 2020-10-23 DIAGNOSIS — R300 Dysuria: Secondary | ICD-10-CM

## 2020-10-23 DIAGNOSIS — R1031 Right lower quadrant pain: Secondary | ICD-10-CM | POA: Diagnosis not present

## 2020-10-23 LAB — CBC
HCT: 40.6 % (ref 36.0–46.0)
Hemoglobin: 13.7 g/dL (ref 12.0–15.0)
MCH: 29.7 pg (ref 26.0–34.0)
MCHC: 33.7 g/dL (ref 30.0–36.0)
MCV: 87.9 fL (ref 80.0–100.0)
Platelets: 239 10*3/uL (ref 150–400)
RBC: 4.62 MIL/uL (ref 3.87–5.11)
RDW: 12.7 % (ref 11.5–15.5)
WBC: 10.9 10*3/uL — ABNORMAL HIGH (ref 4.0–10.5)
nRBC: 0 % (ref 0.0–0.2)

## 2020-10-23 LAB — COMPREHENSIVE METABOLIC PANEL
ALT: 17 U/L (ref 0–44)
AST: 17 U/L (ref 15–41)
Albumin: 3.9 g/dL (ref 3.5–5.0)
Alkaline Phosphatase: 75 U/L (ref 38–126)
Anion gap: 11 (ref 5–15)
BUN: 9 mg/dL (ref 6–20)
CO2: 23 mmol/L (ref 22–32)
Calcium: 9.4 mg/dL (ref 8.9–10.3)
Chloride: 106 mmol/L (ref 98–111)
Creatinine, Ser: 0.8 mg/dL (ref 0.44–1.00)
GFR, Estimated: >60 mL/min (ref >60)
Glucose, Bld: 109 mg/dL — ABNORMAL HIGH (ref 70–99)
Potassium: 3.8 mmol/L (ref 3.5–5.1)
Sodium: 140 mmol/L (ref 135–145)
Total Bilirubin: 0.3 mg/dL (ref 0.3–1.2)
Total Protein: 7.5 g/dL (ref 6.5–8.1)

## 2020-10-23 LAB — POCT URINALYSIS DIP (MANUAL ENTRY)
Bilirubin, UA: NEGATIVE
Glucose, UA: NEGATIVE mg/dL
Ketones, POC UA: NEGATIVE mg/dL
Leukocytes, UA: NEGATIVE
Nitrite, UA: NEGATIVE
Protein Ur, POC: NEGATIVE mg/dL
Spec Grav, UA: 1.025 (ref 1.010–1.025)
Urobilinogen, UA: 0.2 E.U./dL
pH, UA: 7 (ref 5.0–8.0)

## 2020-10-23 LAB — POCT URINE PREGNANCY: Preg Test, Ur: NEGATIVE

## 2020-10-23 LAB — LIPASE, BLOOD: Lipase: 55 U/L — ABNORMAL HIGH (ref 11–51)

## 2020-10-23 MED ORDER — HYDROCODONE-ACETAMINOPHEN 5-325 MG PO TABS: 1.0000 | ORAL_TABLET | Freq: Four times a day (QID) | ORAL | 0 refills | Status: DC | PRN

## 2020-10-23 MED ORDER — ONDANSETRON HCL 4 MG/2ML IJ SOLN
4.0000 mg | INTRAMUSCULAR | Status: AC
  Administered 2020-10-23: 4 mg via INTRAVENOUS
  Filled 2020-10-23: qty 2

## 2020-10-23 MED ORDER — MELOXICAM 15 MG PO TABS: 15.0000 mg | ORAL_TABLET | Freq: Every day | ORAL | 0 refills | Status: DC

## 2020-10-23 MED ORDER — FENTANYL CITRATE (PF) 100 MCG/2ML IJ SOLN
50.0000 ug | Freq: Once | INTRAMUSCULAR | Status: AC | PRN
  Administered 2020-10-23: 50 ug via INTRAVENOUS
  Filled 2020-10-23: qty 2

## 2020-10-23 MED ORDER — HYDROMORPHONE HCL 1 MG/ML IJ SOLN
0.5000 mg | Freq: Once | INTRAMUSCULAR | Status: AC
  Administered 2020-10-23: 0.5 mg via INTRAVENOUS
  Filled 2020-10-23: qty 1

## 2020-10-23 MED ORDER — ONDANSETRON HCL 4 MG PO TABS: 4.0000 mg | ORAL_TABLET | Freq: Three times a day (TID) | ORAL | 0 refills | Status: DC | PRN

## 2020-10-23 MED ORDER — KETOROLAC TROMETHAMINE 30 MG/ML IJ SOLN
30.0000 mg | Freq: Once | INTRAMUSCULAR | Status: AC
  Administered 2020-10-23: 30 mg via INTRAVENOUS
  Filled 2020-10-23: qty 1

## 2020-10-23 NOTE — Discharge Instructions (Signed)
Get help right away if: You have abdominal or pelvic pain that is severe or gets worse. You cannot eat or drink without vomiting. You suddenly develop a fever or chills. Your menstrual period is much heavier than usual. 

## 2020-10-23 NOTE — ED Notes (Signed)
Discharge paperwork reviewed with pt, including prescriptions.  Pt verbalized understanding, had no questions at time of discharge.  Ambulatory to ED exit with significant other.

## 2020-10-23 NOTE — ED Notes (Signed)
Pt ambulatory from triage 

## 2020-10-23 NOTE — ED Provider Notes (Signed)
[facility]    CSN: [id number] Arrival date & time: 10/23/20  0945      History   Chief Complaint Chief Complaint  Patient presents with  . Emesis  . Abdominal Pain  . Back Pain  . Dysuria    HPI Amy Lynch is a 20 y.o. female  With history of ectopic pregnancy presenting for RLQ pain that became constant as of yesterday. States it was periumbilical prior to this. States she did research: Is concern for possible appendicitis. Denies history thereof. Did have single episode of emesis yesterday. No nausea currently. Does have dysuria, though denies vaginal discharge, urgency or frequency. States pain has gotten to the point where walking hurts. No history of hernia.  Past Medical History:  Diagnosis Date  . Ectopic pregnancy 2021  . Medical history non-contributory     There are no problems to display for this patient.   Past Surgical History:  Procedure Laterality Date  . NO PAST SURGERIES      OB History    Gravida  1   Para      Term      Preterm      AB      Living        SAB      IAB      Ectopic      Multiple      Live Births               Home Medications    Prior to Admission medications   Medication Sig Start Date End Date Taking? Authorizing Provider  Norethindrone Acetate-Ethinyl Estrad-FE (LOESTRIN 24 FE) 1-20 MG-MCG(24) tablet Take 1 tablet by mouth daily. 08/03/20   Caren Macadam, MD    Family History Family History  Problem Relation Age of Onset  . Healthy Mother   . Healthy Father     Social History Social History   Tobacco Use  . Smoking status: Never Smoker  . Smokeless tobacco: Never Used  Vaping Use  . Vaping Use: Some days  Substance Use Topics  . Alcohol use: Not Currently  . Drug use: Never     Allergies   Penicillins   Review of Systems Review of Systems  Constitutional: Negative for fatigue and fever.  Respiratory: Negative for cough and shortness of breath.    Cardiovascular: Negative for chest pain and palpitations.  Gastrointestinal: Positive for abdominal pain and vomiting. Negative for blood in stool, constipation and diarrhea.  Genitourinary: Positive for dysuria. Negative for flank pain, frequency, hematuria, pelvic pain, urgency, vaginal bleeding, vaginal discharge and vaginal pain.     Physical Exam Triage Vital Signs ED Triage Vitals  Enc Vitals Group     BP 10/23/20 1010 119/79     Pulse Rate 10/23/20 1010 98     Resp 10/23/20 1010 18     Temp 10/23/20 1010 98.3 F (36.8 C)     Temp Source 10/23/20 1010 Oral     SpO2 10/23/20 1010 96 %     Weight --      Height --      Head Circumference --      Peak Flow --      Pain Score 10/23/20 1008 4     Pain Loc --      Pain Edu? --      Excl. in [place]? --    No data found.  Updated Vital Signs BP 119/79 (BP Location: Left Arm)  Pulse 98   Temp 98.3 F (36.8 C) (Oral)   Resp 18   LMP 06/18/2020 (Exact Date)   SpO2 96%   Breastfeeding No   Visual Acuity Right Eye Distance:   Left Eye Distance:   Bilateral Distance:    Right Eye Near:   Left Eye Near:    Bilateral Near:     Physical Exam Constitutional:      General: She is not in acute distress. HENT:     Head: Normocephalic and atraumatic.  Eyes:     General: No scleral icterus.    Pupils: Pupils are equal, round, and reactive to light.  Cardiovascular:     Rate and Rhythm: Normal rate.  Pulmonary:     Effort: Pulmonary effort is normal.  Abdominal:     General: Bowel sounds are normal. There is no distension or abdominal bruit.     Palpations: Abdomen is soft. There is no hepatomegaly or splenomegaly.     Tenderness: There is abdominal tenderness in the right lower quadrant. Positive signs include Rovsing's sign and McBurney's sign. Negative signs include Murphy's sign.  Skin:    Coloration: Skin is not jaundiced or pale.  Neurological:     Mental Status: She is alert and oriented to person, place, and  time.      UC Treatments / Results  Labs (all labs ordered are listed, but only abnormal results are displayed) Labs Reviewed  POCT URINALYSIS DIP (MANUAL ENTRY) - Abnormal; Notable for the following components:      Result Value   Blood, UA moderate (*)    All other components within normal limits  POCT URINE PREGNANCY    EKG   Radiology No results found.  Procedures Procedures (including critical care time)  Medications Ordered in UC Medications - No data to display  Initial Impression / Assessment and Plan / UC Course  I have reviewed the triage vital signs and the nursing notes.  Pertinent labs & imaging results that were available during my care of the patient were reviewed by me and considered in my medical decision making (see chart for details).     Urine as above, urine pregnancy negative. Currently on cycle. Declined STI testing. Electing to go to ER for further evaluation as she is concerned for acute appendicitis. H&P somewhat concerning for this.  Return precautions discussed, pt verbalized understanding and is agreeable to plan. Final Clinical Impressions(s) / UC Diagnoses   Final diagnoses:  Dysuria  Continuous RLQ abdominal pain   Discharge Instructions   None    ED Prescriptions    None     PDMP not reviewed this encounter.   [place], [place], [place] 10/23/20 1140

## 2020-10-23 NOTE — ED Provider Notes (Signed)
[facility] DEPT Provider Note   CSN: [id number] Arrival date & time: 10/23/20  1206     History Chief Complaint  Patient presents with  . Abdominal Pain  . Dysuria  . Emesis    Amy Lynch is a 20 y.o. female who presents emergency department chief complaint of right lower quadrant abdominal pain.  Patient complains of pain in the right lower quadrant that began around 11:30 PM.  She did start her menstrual period yesterday evening.  She states that during the night the pain became severe, sharp.  She is unable to find a position of comfort.  She had associated vomiting.  She feels pressure to urinate and pain in the bilateral flanks.  She had a previous ectopic pregnancy back in November 2021.  She states that the pain is the same as when she had to use methotrexate.  She denies diarrhea, fevers, chills.  Pain is made worse by movement or coughing.  HPI     Past Medical History:  Diagnosis Date  . Ectopic pregnancy 2021  . Medical history non-contributory     There are no problems to display for this patient.   Past Surgical History:  Procedure Laterality Date  . NO PAST SURGERIES       OB History    Gravida  1   Para      Term      Preterm      AB      Living        SAB      IAB      Ectopic      Multiple      Live Births              Family History  Problem Relation Age of Onset  . Healthy Mother   . Healthy Father     Social History   Tobacco Use  . Smoking status: Never Smoker  . Smokeless tobacco: Never Used  Vaping Use  . Vaping Use: Former  Substance Use Topics  . Alcohol use: Not Currently  . Drug use: Never    Home Medications Prior to Admission medications   Medication Sig Start Date End Date Taking? Authorizing Provider  fexofenadine (ALLEGRA) 180 MG tablet Take 180 mg by mouth daily as needed for allergies or rhinitis.   Yes [provider]  Ronnette Juniper FE 1/20 1-20 MG-MCG  tablet Take 1 tablet by mouth daily. 09/28/20  Yes [provider]  meloxicam (MOBIC) 15 MG tablet Take 1 tablet (15 mg total) by mouth daily. Take 1 daily with food. 10/23/20  Yes Margarita Mail, PA-C  Multiple Vitamin (MULTIVITAMIN WITH MINERALS) TABS tablet Take 1 tablet by mouth daily.   Yes [provider]  ondansetron (ZOFRAN) 4 MG tablet Take 1 tablet (4 mg total) by mouth every 8 (eight) hours as needed for nausea or vomiting. 10/23/20  Yes Sugey Trevathan, PA-C  Phenylephrine HCl (EQ NASAL SPRAY FAST ACTING NA) Place 2 sprays into the nose daily as needed (allergy).   Yes [provider]  HYDROcodone-acetaminophen (NORCO) 5-325 MG tablet Take 1 tablet by mouth every 6 (six) hours as needed for severe pain. 10/23/20   Destyne Goodreau, Vernie Shanks, PA-C  Norethindrone Acetate-Ethinyl Estrad-FE (LOESTRIN 24 FE) 1-20 MG-MCG(24) tablet Take 1 tablet by mouth daily. Patient not taking: No sig reported 08/03/20   Caren Macadam, MD    Allergies    Penicillins  Review of Systems   Review  of Systems Ten systems reviewed and are negative for acute change, except as noted in the HPI.   Physical Exam Updated Vital Signs BP 126/74   Pulse 78   Temp 98.7 F (37.1 C)   Resp 16   Ht 5\' 2"  (1.575 m)   Wt 101.6 kg   LMP 10/19/2020 Comment: neg hcg   SpO2 98%   BMI 40.97 kg/m   Physical Exam Vitals and nursing note reviewed.  Constitutional:      General: She is not in acute distress.    Appearance: She is well-developed and well-nourished. She is not diaphoretic.  HENT:     Head: Normocephalic and atraumatic.  Eyes:     General: No scleral icterus.    Conjunctiva/sclera: Conjunctivae normal.  Cardiovascular:     Rate and Rhythm: Normal rate and regular rhythm.     Heart sounds: Normal heart sounds. No murmur heard. No friction rub. No gallop.   Pulmonary:     Effort: Pulmonary effort is normal. No respiratory distress.     Breath sounds: Normal breath sounds.   Abdominal:     General: Bowel sounds are normal. There is no distension.     Palpations: Abdomen is soft. There is no mass.     Tenderness: There is abdominal tenderness in the right lower quadrant. There is no right CVA tenderness, left CVA tenderness or guarding.  Musculoskeletal:     Cervical back: Normal range of motion.  Skin:    General: Skin is warm and dry.  Neurological:     Mental Status: She is alert and oriented to person, place, and time.  Psychiatric:        Behavior: Behavior normal.     ED Results / Procedures / Treatments   Labs (all labs ordered are listed, but only abnormal results are displayed) Labs Reviewed  LIPASE, BLOOD - Abnormal; Notable for the following components:      Result Value   Lipase 55 (*)    All other components within normal limits  COMPREHENSIVE METABOLIC PANEL - Abnormal; Notable for the following components:   Glucose, Bld 109 (*)    All other components within normal limits  CBC - Abnormal; Notable for the following components:   WBC 10.9 (*)    All other components within normal limits  WET PREP, GENITAL  GC/CHLAMYDIA PROBE AMP () NOT AT [facility]    EKG None  Radiology CT Renal Stone Study  Result Date: 10/23/2020 CLINICAL DATA:  Flank pain, hematuria, kidney stone; history of ectopic pregnancy in October 2021 EXAM: CT ABDOMEN AND PELVIS WITHOUT CONTRAST TECHNIQUE: Multidetector CT imaging of the abdomen and pelvis was performed following the standard protocol without IV contrast. Sagittal and coronal MPR images reconstructed from axial data set. No oral contrast administered. COMPARISON:  None FINDINGS: Lower chest: Lung bases clear Hepatobiliary: Gallbladder and liver normal appearance Pancreas: Normal appearance Spleen: Normal appearance Adrenals/Urinary Tract: Adrenal glands, kidneys, ureters, and bladder normal appearance. No urinary tract calcification or dilatation. Stomach/Bowel: Normal appendix. Stomach and bowel  loops normal appearance Vascular/Lymphatic: Aorta normal caliber.  No adenopathy. Reproductive: Uterus unremarkable. 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 This is adjacent to both ovaries but appears more associated with the RIGHT ovary than LEFT favor RIGHT ovarian origin. Other: Small amount of low-attenuation free fluid in pelvis. No free air.  No hernia. Musculoskeletal: Osseous structures unremarkable. 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 This is adjacent to both ovaries but appears more associated with the RIGHT ovary than LEFT favor RIGHT ovarian origin. If there is clinical concern for ovarian torsion, recommend pelvic sonography with dedicated doppler assessment. Small amount of nonspecific free fluid in pelvis. No urinary tract calcification or dilatation. Findings called to Margarita Mail PA on 10/23/2020 at 1428 hours. Electronically Signed   By: Lavonia Dana M.D.   On: 10/23/2020 14:30   US PELVIC COMPLETE W TRANSVAGINAL AND TORSION R/O  Result Date: 10/23/2020 CLINICAL DATA:  Pelvic pain. History of an ectopic pregnancy treated with surgery. History of ovarian cysts. EXAM: TRANSABDOMINAL AND TRANSVAGINAL ULTRASOUND OF PELVIS DOPPLER ULTRASOUND OF OVARIES TECHNIQUE: Both transabdominal and transvaginal ultrasound examinations of the pelvis were performed. Transabdominal technique was performed for global imaging of the pelvis including uterus, ovaries, adnexal regions, and pelvic cul-de-sac. It was necessary to proceed with endovaginal exam following the transabdominal exam to visualize the uterus, endometrium and ovaries to better advantage. Color and duplex Doppler ultrasound was utilized to  evaluate blood flow to the ovaries. COMPARISON:  Current unenhanced abdomen and pelvis CT. Ob ultrasound, 07/15/2020 FINDINGS: Uterus Measurements: 7.9 x 3.9 x 5.0 cm = volume: 79.3 mL. No fibroids or other mass visualized. Endometrium Thickness: 5 mm.  No focal abnormality visualized. Right ovary Measurements: 5.3 x 4.3 x 4.6 cm = volume: 55.2 mL. Simple appearing cyst measuring 4 x 3.5 x 3.8 cm. Left ovary Measurements: 5.1 x 5.0 x 5.0 cm = volume: 67 mL. Complex cyst measuring 4.4 x 2.8 x 3.5 cm with several septations. Pulsed Doppler evaluation of both ovaries demonstrates normal low-resistance arterial and venous waveforms. Other findings Small amount pelvic free fluid. IMPRESSION: 1. Bilateral ovarian cysts, on the right simple appearing measuring 4 cm in size and on the left complex with several septations measuring 4.4 cm. Ovaries lie in close proximity above the uterus near the midline with the combination of the 2 ovaries and there are cysts accounting for the bilobed appearing mass noted on CT. A similar appearance was present on the prior OB ultrasound from 07/15/2020. Given the complex nature of cyst on the left, follow-up ultrasound in 2 months is recommended. 2. No evidence of ovarian torsion. 3. Small amount of pelvic free fluid. 4. Normal uterus and endometrium. Electronically Signed   By: Lajean Manes M.D.   On: 10/23/2020 15:52    Procedures Procedures   Medications Ordered in ED Medications  fentaNYL (SUBLIMAZE) injection 50 mcg (50 mcg Intravenous Given 10/23/20 1308)    And  ondansetron (ZOFRAN) injection 4 mg (4 mg Intravenous Given 10/23/20 1309)  HYDROmorphone (DILAUDID) injection 0.5 mg (0.5 mg Intravenous Given 10/23/20 1535)  ketorolac (TORADOL) 30 MG/ML injection 30 mg (30 mg Intravenous Given 10/23/20 1734)    ED Course  I have reviewed the triage vital signs and the nursing notes.  Pertinent labs & imaging results that were available during my care of the patient were  reviewed by me and considered in my medical decision making (see chart for details).  Clinical Course as of 10/23/20 1900  Fri Oct 23, 2020  1706 Case discussed with Dr. Roselie Awkward.  We reviewed the images from November as well as pelvic ultrasound from today.  Do not feel that she needs any emergent intervention. [  AH]    Clinical Course User Index [AH] Margarita Mail, PA-C   MDM Rules/Calculators/A&P                          Amy Lynch is a 20 y.o. female who presents with Pelvic pain The history is gathered by patient and emr Vitals:   10/23/20 1615 10/23/20 1630 10/23/20 1645 10/23/20 1700  BP:  132/77  126/74  Pulse: 99 93 91 78  Resp: (!) 22 13 18 16   Temp:      TempSrc:      SpO2: 96% 96% 96% 98%  Weight:      Height:        EKG Interpretation  Date/Time:    Ventricular Rate:    PR Interval:    QRS Duration:   QT Interval:    QTC Calculation:   R Axis:     Text Interpretation:        I personally reviewed the patient's labs which show CBC which is elevated white blood cell count of 10.9 likely acute phase reaction, lipase slightly above normal, CMP with slightly elevated blood glucose also likely acute phase reaction.  I personally reviewed the images CT renal stone study and pelvic ultrasound which show large pelvic mass on CT renal stone study.  Transvaginal shows left bilobed, large ovarian septated cyst and a new one on the right ovary as well.  There is good blood flow to both ovaries.  I consulted the case with Dr. Roselie Awkward about the images.  I have concerned that her severe pain earlier today with vomiting lasting proximately 1 hour may have been an intermittent torsion given the large ovarian cyst.  As she has good blood flow and controlled pain at this time Dr. Roselie Awkward does not feel she needs to come in unless there is an acute change prior to discharge.  I reviewed the findings with the patient and will discharge her with pain control with the caveat  that should she have return of severe pain like she did last night she should go immediately to [facility] emergency department for evaluation.  She understands that she is otherwise safe to be discharged and should follow very closely at the [facility] this coming week.   Given the large differential diagnosis for Amy Lynch, the decision making in this case is of high complexity.  After evaluating all of the data points in this case, the presentation of Amy Lynch is NOT consistent with AAA; Mesenteric Ischemia; Bowel Perforation; Bowel Obstruction; Sigmoid Volvulus; Diverticulitis; Appendicitis; Peritonitis; Cholecystitis, ascending cholangitis or other gallbladder disease; perforated ulcer; significant GI bleeding, splenic rupture/infarction; Hepatic abscess; or other surgical/acute abdomen.  Similarly, this presentation is NOT consistent with fistula; incarcerated hernia; Pancreatitis; Diabetic Ketoacidosis; Kidney Stone; Ischemic colitis; Psoas or other abscess; Methanol poisoning; Heavy metal toxicity; or porphyria.  The case is NOT consistent with Fitz-Hugh-Curtis Syndrome, Ectopic Pregnancy, Placental Abruption, PID, Tubo-ovarian abscess, Ovarian Torsion, or STI. This presentation is also NOT consistent with acute coronary syndrome, MI, pulmonary embolism, dissection, borhaave's, arrythmia, pneumothorax, cardiac tamponade, or other emergent cardiopulmonary condition. Moreover, this presentation is NOT consistent with sepsis, pyelonephritis, urinary infection, pneumonia, or other focal bacterial infection.  Strict return and follow-up precautions have been given by me personally  ore by detailed written instructions verbalized by nursing staff using the teach back method to the patient/family/caregiver(s).  Data Reviewed/Counseling: I have reviewed the patient's vital signs, nursing  notes, and other relevant tests/information. I had a detailed discussion regarding  the historical points, exam findings, and any diagnostic results supporting the discharge diagnosis. I also discussed the need for outpatient follow-up and the need to return to the ED if symptoms worsen or if there are any questions or concerns that arise at home   Final Clinical Impression(s) / ED Diagnoses Final diagnoses:  Cysts of both ovaries    Rx / DC Orders ED Discharge Orders         Ordered    HYDROcodone-acetaminophen (NORCO) 5-325 MG tablet  Every 6 hours PRN,   Status:  Discontinued        10/23/20 1710    meloxicam (MOBIC) 15 MG tablet  Daily        10/23/20 1710    ondansetron (ZOFRAN) 4 MG tablet  Every 8 hours PRN        10/23/20 1710    HYDROcodone-acetaminophen (NORCO) 5-325 MG tablet  Every 6 hours PRN        10/23/20 1722           Margarita Mail, PA-C 10/23/20 1909    Dorie Rank, MD 10/24/20 [phone]

## 2020-10-23 NOTE — ED Triage Notes (Addendum)
patient c/o pain below the umbilicus, emesis x 2 and when lying flat she has pain in her back and when she is moving the pain worsens in the abdomen. Patient also c/o dysuria.  [facility]was at the [facility] today and was told to come to the ED because they did not have a CT scanner.  Patient had a negative pregnancy test and a urinalysis completed at the UC.

## 2020-10-23 NOTE — ED Triage Notes (Signed)
Pt states that she vomited x1 yesterday,  lower abdominal pain, dysuria, and lower back pain. Pt states that she had an etopic pregnancy in October and states that the pain is similar.

## 2020-10-23 NOTE — ED Notes (Signed)
Pt transported to [facility]

## 2020-11-05 ENCOUNTER — Encounter: Payer: Self-pay | Admitting: Obstetrics and Gynecology

## 2020-11-05 ENCOUNTER — Ambulatory Visit (INDEPENDENT_AMBULATORY_CARE_PROVIDER_SITE_OTHER): Payer: Medicaid Other | Admitting: Obstetrics and Gynecology

## 2020-11-05 ENCOUNTER — Other Ambulatory Visit: Payer: Self-pay

## 2020-11-05 DIAGNOSIS — N83202 Unspecified ovarian cyst, left side: Secondary | ICD-10-CM | POA: Diagnosis not present

## 2020-11-05 DIAGNOSIS — N83201 Unspecified ovarian cyst, right side: Secondary | ICD-10-CM | POA: Insufficient documentation

## 2020-11-05 NOTE — Progress Notes (Signed)
MS Bose presents for ER f/u . Seen in ER end of Jan for pelvic pain. Dx with bilateral ovarian cysts by U/S. Pt reports pain has now resolved. Denies any bowel or bladder dysfunction LMP end of January. Cycles regular. Takes OCP's H/O ectopic pregnancy last Oct, treated with [facility]  PE AF VSS  Lungs clear  Heart RRR Abd soft, + BS GU Nl EGBUS, uterus small mobile non tender, slight left adnexal fullness, no adnexal tenderness  A/P Bilateral ovarian cysts.  Reviewed with pt. Information provided as well. Continue with OCP's. Check GYN U/S in 2 months. F/U per U/S results

## 2020-11-05 NOTE — Patient Instructions (Signed)
Ovarian Cyst  An ovarian cyst is a fluid-filled sac on an ovary. Most of these cysts go away on their own and are not cancer. Some cysts need treatment. What are the causes?  Ovarian hyperstimulation syndrome. Some medicines may lead to this problem.  Polycystic ovarian syndrome (PCOS). Problems with body chemicals (hormones) can lead to this condition.  The normal menstrual cycle. What increases the risk?  Being overweight or very overweight.  Taking medicines to increase your chance of getting pregnant.  Using some types of birth control.  Smoking. What are the signs or symptoms? Many ovarian cysts do not cause symptoms. If you get symptoms, you may have:  Pain or pressure in the area between the hip bones.  Pain in the lower belly.  Pain during sex.  Swelling in the lower belly.  Periods that are not regular.  Pain with periods. How is this treated? Many ovarian cysts go away on their own without treatment. If you need treatment, it may include:  Medicines for pain.  Fluid taken out of the cyst.  The cyst being taken out.  Birth control pills or other medicines.  Surgery to remove the ovary. Follow these instructions at home:  Take over-the-counter and prescription medicines only as told by your doctor.  Ask your doctor if you should avoid driving or using machines while you are taking your medicine.  Get exams and Pap tests as told by your doctor.  Return to your normal activities when your doctor says that it is safe.  Do not smoke or use any products that contain nicotine or tobacco. If you need help quitting, ask your doctor.  Keep all follow-up visits. Contact a doctor if:  Your periods: ? Are late. ? Are not regular. ? Stop. ? Are painful.  You have pain in the area between your hip bones, and the pain does not go away.  You feel pressure on your bladder.  You have trouble peeing.  You feel full, or your belly hurts, swells, or  bloats.  You gain or lose weight without trying, or you are less hungry than normal.  You feel pain and pressure in your back.  You feel pain and pressure in the area between your hip bones.  You think you may be pregnant. Get help right away if:  You have pain in your belly that is very bad or gets worse.  You have pain in the area between your hip bones, and the pain is very bad or gets worse.  You cannot eat or drink without vomiting.  You get a fever or chills all of a sudden.  Your period is a lot heavier than usual. Summary  An ovarian cyst is a fluid-filled sac on an ovary.  Some cysts may cause problems and need treatment.  Most of these cysts go away on their own. This information is not intended to replace advice given to you by your health care provider. Make sure you discuss any questions you have with your health care provider. Document Revised: 02/20/2020 Document Reviewed: 02/20/2020 Elsevier Patient Education  2021 [employer]  

## 2020-11-18 ENCOUNTER — Other Ambulatory Visit: Payer: Self-pay

## 2020-11-18 ENCOUNTER — Ambulatory Visit
Admission: RE | Admit: 2020-11-18 | Discharge: 2020-11-18 | Disposition: A | Discharge status: Home / Self Care | Payer: Medicaid Other | Source: Ambulatory Visit | Attending: Obstetrics and Gynecology | Admitting: Obstetrics and Gynecology

## 2020-11-18 DIAGNOSIS — N83202 Unspecified ovarian cyst, left side: Secondary | ICD-10-CM | POA: Insufficient documentation

## 2020-11-18 DIAGNOSIS — N83201 Unspecified ovarian cyst, right side: Secondary | ICD-10-CM | POA: Insufficient documentation

## 2020-11-23 ENCOUNTER — Other Ambulatory Visit: Payer: Self-pay | Admitting: Obstetrics and Gynecology

## 2020-11-23 ENCOUNTER — Telehealth: Payer: Self-pay | Admitting: Lactation Services

## 2020-11-23 DIAGNOSIS — N83201 Unspecified ovarian cyst, right side: Secondary | ICD-10-CM

## 2020-11-23 NOTE — Telephone Encounter (Signed)
-----   Message from Chancy Milroy, MD sent at 11/23/2020  3:31 PM EST ----- Please schedule pt for pelvic MRI. I have placed order and pt is aware. Thanks Legrand Como

## 2020-11-23 NOTE — Telephone Encounter (Signed)
Called Radiology Scheduling ans spoke with Manuela Schwartz to schedule MRI for patient. MRI scheduled for Sunday March 6 at 9 am. Check in at 8:30 at [facility] main entrance.   Called patient and informed her of appointment time and location. Radiology phone number given to call if needs to call and reschedule [phone].   Patient reports she will be out of town, she is going to call Radiology to reschedule at her convenience.

## 2020-11-29 ENCOUNTER — Ambulatory Visit (HOSPITAL_COMMUNITY): Payer: Medicaid Other

## 2020-12-04 ENCOUNTER — Ambulatory Visit (HOSPITAL_COMMUNITY): Admission: RE | Admit: 2020-12-04 | Payer: Medicaid Other | Source: Ambulatory Visit

## 2020-12-10 ENCOUNTER — Telehealth: Payer: Self-pay | Admitting: Lactation Services

## 2020-12-10 DIAGNOSIS — N83201 Unspecified ovarian cyst, right side: Secondary | ICD-10-CM

## 2020-12-10 NOTE — Telephone Encounter (Signed)
Elberta Fortis in Centralized Scheduling called and reports Radiologist recommends MRI with and without contrast for upcoming MRI scheduled for tomorrow.  Order changed and called Centralized Scheduling for Radiology to inform of change in order.

## 2020-12-11 ENCOUNTER — Ambulatory Visit (HOSPITAL_COMMUNITY)
Admission: RE | Admit: 2020-12-11 | Discharge: 2020-12-11 | Disposition: A | Discharge status: Home / Self Care | Payer: Medicaid Other | Source: Ambulatory Visit | Attending: Obstetrics and Gynecology | Admitting: Obstetrics and Gynecology

## 2020-12-11 ENCOUNTER — Other Ambulatory Visit: Payer: Self-pay

## 2020-12-11 DIAGNOSIS — N83202 Unspecified ovarian cyst, left side: Secondary | ICD-10-CM | POA: Diagnosis present

## 2020-12-11 DIAGNOSIS — N83201 Unspecified ovarian cyst, right side: Secondary | ICD-10-CM | POA: Insufficient documentation

## 2020-12-11 MED ORDER — GADOBUTROL 1 MMOL/ML IV SOLN
20.0000 mL | Freq: Once | INTRAVENOUS | Status: AC | PRN
  Administered 2020-12-11: 20 mL via INTRAVENOUS

## 2021-01-25 ENCOUNTER — Other Ambulatory Visit: Payer: Self-pay

## 2021-01-25 ENCOUNTER — Ambulatory Visit (INDEPENDENT_AMBULATORY_CARE_PROVIDER_SITE_OTHER): Payer: Medicaid Other | Admitting: Obstetrics and Gynecology

## 2021-01-25 ENCOUNTER — Encounter: Payer: Self-pay | Admitting: Obstetrics and Gynecology

## 2021-01-25 DIAGNOSIS — D270 Benign neoplasm of right ovary: Secondary | ICD-10-CM | POA: Insufficient documentation

## 2021-01-25 DIAGNOSIS — D279 Benign neoplasm of unspecified ovary: Secondary | ICD-10-CM | POA: Insufficient documentation

## 2021-01-25 NOTE — Patient Instructions (Signed)
Diagnostic Laparoscopy Diagnostic laparoscopy is a procedure to diagnose problems in the abdomen. It might be done for a variety of reasons, such as to look for scar tissue, a reason for abdominal pain, an abdominal mass or tumor, or fluid in the abdomen (ascites). This procedure may also be done to remove a tissue sample from the liver to look at under a microscope (biopsy). During the procedure, a thin, flexible tube that has a light and a camera on the end (laparoscope) is inserted through a small incision in the abdomen. The image from the camera is shown on a monitor to help the surgeon see inside the body. Tell a health care provider about:  Any allergies you have.  All medicines you are taking, including vitamins, herbs, eye drops, creams, and over-the-counter medicines.  Any problems you or family members have had with anesthetic medicines.  Any blood disorders you have.  Any surgeries you have had.  Any medical conditions you have.  Whether you are pregnant or may be pregnant. What are the risks? Generally, this is a safe procedure. However, problems may occur, including:  Infection.  Bleeding.  Allergic reactions to medicines or dyes.  Damage to abdominal structures or organs, such as the intestines, liver, stomach, or spleen. What happens before the procedure? Staying hydrated Follow instructions from your health care provider about hydration, which may include:  Up to 2 hours before the procedure - you may continue to drink clear liquids, such as water, clear fruit juice, black coffee, and plain tea.   Eating and drinking restrictions Follow instructions from your health care provider about eating and drinking, which may include:  8 hours before the procedure - stop eating heavy meals or foods, such as meat, fried foods, or fatty foods.  6 hours before the procedure - stop eating light meals or foods, such as toast or cereal.  6 hours before the procedure - stop  drinking milk or drinks that contain milk.  2 hours before the procedure - stop drinking clear liquids. Medicines Ask your health care provider about:  Changing or stopping your regular medicines. This is especially important if you are taking diabetes medicines or blood thinners.  Taking medicines such as aspirin and ibuprofen. These medicines can thin your blood. Do not take these medicines unless your health care provider tells you to take them.  Taking over-the-counter medicines, vitamins, herbs, and supplements. General instructions  Ask your health care provider: ? How your surgery site will be marked. ? What steps will be taken to help prevent infection. These steps may include:  Removing hair at the surgery site.  Washing skin with a germ-killing soap.  Taking antibiotic medicine.  Plan to have a responsible adult take you home from the hospital or clinic.  Plan to have a responsible adult care for you for the time you are told after you leave the hospital or clinic. This is important. What happens during the procedure?  An IV will be inserted into one of your veins.  You will be given one or more of the following: ? A medicine to help you relax (sedative). ? A medicine to numb the area (local anesthetic). ? A medicine to make you fall asleep (general anesthetic).  A breathing tube will be placed down your throat to help you breathe during the procedure.  Your abdomen will be filled with an air-like gas so that your abdomen expands. This will give the surgeon more room to operate and will make  your organs easier to see.  Many small incisions will be made in your abdomen.  A laparoscope and other surgical instruments will be inserted into your abdomen through these incisions.  A biopsy may be done. This will depend on the reason why you are having this procedure.  The laparoscope and other instruments will be removed from your abdomen.  The air-like gas will be  released from your abdomen.  Your incisions will be closed with stitches (sutures), skin glue, or surgical tapes and covered with a bandage (dressing).  Your breathing tube will be removed. The procedure may vary among health care providers and hospitals.   What happens after the procedure?  Your blood pressure, heart rate, breathing rate, and blood oxygen level will be monitored until you leave the hospital or clinic.  If you were given a sedative during the procedure, it can affect you for several hours. Do not drive or operate machinery until your health care provider says that it is safe.  It is up to you to get the results of your procedure. Ask your health care provider, or the department that is doing the procedure, when your results will be ready. Summary  Diagnostic laparoscopy is a procedure to diagnose problems in the abdomen using a thin, flexible tube that has a light and a camera on the end (laparoscope).  Follow instructions from your health care provider about how to prepare for the procedure.  Plan to have a responsible adult care for you for the time you are told after you leave the hospital or clinic. This is important. This information is not intended to replace advice given to you by your health care provider. Make sure you discuss any questions you have with your health care provider. Document Revised: 05/08/2020 Document Reviewed: 05/08/2020 Elsevier Patient Education  2021 [place]. Ovarian Cystectomy, Care After This sheet gives you information about how to care for yourself after your procedure. Your health care provider may also give you more specific instructions. If you have problems or questions, contact your health care provider. What can I expect after the procedure? After the procedure, it is common to have:  Pain in the abdomen, especially at the incision areas. You will be given pain medicine to control the pain.  Tiredness. This is a normal part  of the recovery process. Your energy level will return to normal over the next several weeks. Follow these instructions at home: Medicines  Take over-the-counter and prescription medicines only as told by your health care provider.  If you were prescribed an antibiotic medicine, use it as told by your health care provider. Do not stop using the antibiotic even if you start to feel better.  Do not take aspirin because it can cause bleeding.  Ask your health care provider if the medicine prescribed to you: ? Requires you to avoid driving or using heavy machinery. ? Can cause constipation. You may need to take these actions to prevent or treat constipation:  Drink enough fluid to keep your urine pale yellow.  Take over-the-counter or prescription medicines.  Eat foods that are high in fiber, such as beans, whole grains, and fresh fruits and vegetables.  Limit foods that are high in fat and processed sugars, such as fried or sweet foods. Incision care  Follow instructions from your health care provider about how to take care of your incisions. Make sure you: ? Wash your hands with soap and water for at least 20 seconds before and after  you change your bandage (dressing). If soap and water are not available, use hand sanitizer. ? Change your dressing as told by your health care provider. ? Leave stitches (sutures), skin glue, or adhesive strips in place. These skin closures may need to stay in place for 2 weeks or longer. If adhesive strip edges start to loosen and curl up, you may trim the loose edges. Do not remove adhesive strips completely unless your health care provider tells you to do that.  Check your incision areas every day for signs of infection. Check for: ? More redness, swelling, or pain. ? Fluid or blood. ? Warmth. ? Pus or a bad smell.  Do not take baths, swim, or use a hot tub until your health care provider approves. Ask your health care provider if you may take  showers. You may only be allowed to take sponge baths.   Activity  Rest as told by your health care provider.  Avoid sitting for a long time without moving. Get up to take short walks every 1-2 hours. This is important to improve blood flow and breathing. Ask for help if you feel weak or unsteady.  Do not lift anything that is heavier than 10 lb (4.5 kg), or the limit that you are told, until your health care provider says that it is safe.  Return to your normal activities and diet as told by your health care provider. Ask your health care provider what activities are safe for you. General instructions  Do not douche, use tampons, or have sex until your health care provider says it is okay.  Do not use any products that contain nicotine or tobacco, such as cigarettes, e-cigarettes, and chewing tobacco. These can delay incision healing after surgery. If you need help quitting, ask your health care provider.  Keep all follow-up visits. This is important. Contact a health care provider if:  You have a fever or chills.  You feel nauseous or you vomit.  You have pain when you urinate or have blood in your urine.  You have a rash on your body.  You have pain or redness where the IV was inserted.  You have pain that is not relieved with medicine.  You have any of these signs of infection: ? More redness, swelling, or pain around an incision. ? Fluid or blood coming from an incision. ? Warmth coming from an incision. ? Pus or a bad smell coming from an incision. Get help right away if:  You have chest pain or shortness of breath.  You feel dizzy or light-headed.  You have heavy bleeding.  You have increasing abdominal pain that is not relieved with medicine.  You have pain, swelling, or redness in your leg.  Your incision is opening and the edges are not staying together. Summary  After the procedure, it is common to have some pain in your abdomen. You will be given pain  medicine to control the pain.  Follow instructions from your health care provider about how to take care of your incisions.  Do not douche, use tampons, or have sex until your health care provider says it okay.  Keep all follow-up visits. This is important. This information is not intended to replace advice given to you by your health care provider. Make sure you discuss any questions you have with your health care provider. Document Revised: 02/20/2020 Document Reviewed: 02/20/2020 Elsevier Patient Education  2021 [employer].

## 2021-01-25 NOTE — Progress Notes (Signed)
Amy Lynch presents to discuss MRI results. 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 Ovarian teratoma's reviewed with pt. Surgerical management recommended to pt.  PE AF VSS Lungs clear  Heart RRR Abd soft + BS  A/P Right ovarian teratoma Lap ovarian cystectomy reviewed with pt. R/B/Post op care discussed. Information provided. Going to discuss with mother about having surgery here in [place] or in [place] ( pt lives in here) this summer while out of school. Pt will call back with her choice of surgery location. OK to have one of my partners complete surgery if needed d/t a time factor. F/U PRN or Post op

## 2021-02-03 ENCOUNTER — Encounter: Payer: Self-pay | Admitting: *Deleted

## 2021-02-12 ENCOUNTER — Telehealth: Payer: Self-pay | Admitting: *Deleted

## 2021-02-12 NOTE — Telephone Encounter (Signed)
Call to patient regarding My Chart message and scheduling surgery. Voice mail box is not set up- unable to leave message.   Will send My Chart message.

## 2021-02-15 NOTE — Telephone Encounter (Signed)
Telephone call to patient. Dates discussed. Due to patient's travel and date needs, Dr Rip Harbour offers alternate partner. Patient is agreeable.  Will contact patient back once review with providers.

## 2021-02-17 NOTE — Telephone Encounter (Signed)
Call to patient regarding My Chart message.  Advised schedule office visit with MD that is available during her preferred dates.  Patient 1st choice is week of 6-21. Advised will check available options and cal her back.

## 2021-02-24 NOTE — Telephone Encounter (Signed)
Patient has appointment with Dr Sabra Heck on 02-25-21.   Encounter closed.

## 2021-02-25 ENCOUNTER — Encounter (HOSPITAL_BASED_OUTPATIENT_CLINIC_OR_DEPARTMENT_OTHER): Payer: Self-pay | Admitting: Obstetrics & Gynecology

## 2021-02-25 ENCOUNTER — Ambulatory Visit (INDEPENDENT_AMBULATORY_CARE_PROVIDER_SITE_OTHER): Payer: Medicaid Other | Admitting: Obstetrics & Gynecology

## 2021-02-25 ENCOUNTER — Other Ambulatory Visit: Payer: Self-pay

## 2021-02-25 ENCOUNTER — Other Ambulatory Visit (HOSPITAL_COMMUNITY)
Admission: RE | Admit: 2021-02-25 | Discharge: 2021-02-25 | Disposition: A | Discharge status: Home / Self Care | Payer: Medicaid Other | Source: Ambulatory Visit | Attending: Obstetrics & Gynecology | Admitting: Obstetrics & Gynecology

## 2021-02-25 VITALS — BP 127/87 | HR 99 | Ht 63.0 in | Wt 233.0 lb

## 2021-02-25 DIAGNOSIS — D270 Benign neoplasm of right ovary: Secondary | ICD-10-CM | POA: Diagnosis not present

## 2021-02-25 DIAGNOSIS — Z6841 Body Mass Index (BMI) 40.0 and over, adult: Secondary | ICD-10-CM

## 2021-02-25 DIAGNOSIS — Z124 Encounter for screening for malignant neoplasm of cervix: Secondary | ICD-10-CM | POA: Insufficient documentation

## 2021-02-25 DIAGNOSIS — Z113 Encounter for screening for infections with a predominantly sexual mode of transmission: Secondary | ICD-10-CM

## 2021-02-25 DIAGNOSIS — Z8759 Personal history of other complications of pregnancy, childbirth and the puerperium: Secondary | ICD-10-CM

## 2021-02-26 DIAGNOSIS — Z8759 Personal history of other complications of pregnancy, childbirth and the puerperium: Secondary | ICD-10-CM | POA: Insufficient documentation

## 2021-02-26 LAB — CYTOLOGY - PAP
Chlamydia: NEGATIVE
Comment: NEGATIVE
Comment: NORMAL
Diagnosis: NEGATIVE
Neisseria Gonorrhea: NEGATIVE

## 2021-02-26 NOTE — Progress Notes (Signed)
GYNECOLOGY  VISIT  CC:   Discuss possible surgery  HPI: 20 y.o. G31P0010 Single White or Caucasian female here for discussion of treatment for right 7 x 5 x 5cm ovary containing a 4.9 x 4.4 x 4.6cm mass consistent with a mature teratoma.  This was initially noted on imaging in October, 2021, when pt was diagnosed with an ectopic pregnancy.  No specific finding on ultrasound regarding the ectopic was seen but  HCG was >3000 at that time.  She was treated with MTX.  HCGs normalized after treatment.  Then in January, pt had pelvic pain again and was concerned about another ectopic pregnancy.  She went to the ER where imaging was done and there was a mass on an ovary but this seems to have been both the dermoid on the left and a simple cyst on the right.  Two additional ultrasounds were not definitive so an MRI was obtained confirming the right dermoid.  (CT, ultrasounds, MRI and Dr. Marjory Lies notes from [facility] and May reviewed as well as the treatment for the ectopic pregnancy).  Pt has a good understanding of what a dermoid is.  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  She does live in [facility] but has returned here for care.  Removal has been recommended and she is here for this discussion.    Laparoscopic removal of dermoid vs removal of ovary discussed.  Given age, feel appropriate to try and remove dermoid only.  Pt understands a portion of dermoid could be left in this situation but due to her young age, I think this is the best course of action if there appears to be a plane between dermoid and ovary and removal of dermoid only possible.  Pt is comfortable with this plan and would desire to retain portion of ovary if possible.  Possible decrease in fertility discussed.  Pt not actively trying to get pregnant at this point and is on OCPs.  Procedure discussed with patient.  Hospital stay, recovery and pain management all discussed.  Risks discussed  including but not limited to bleeding, <1% risk of receiving a  transfusion, infection, <1% risk of bowel/bladder/ureteral/vascular injury discussed as well as possible need for additional surgery if injury does occur discussed.  DVT/PE and rare risk of death discussed.  My actual complications with prior surgeries discussed.  Positioning and incision locations discussed.  Patient aware tissue will be sent for pathology and if any significant abnormal she may need additional treatment.  All questions answered.   Separately, pt does need a pap smear and updated STD testing which will be obtained today as well.   GYNECOLOGIC HISTORY: Patient's last menstrual period was 06/19/2020. Contraception: OCPs  Patient Active Problem List   Diagnosis Date Noted  . Ovarian teratoma 01/25/2021    Past Medical History:  Diagnosis Date  . Ectopic pregnancy 2021    Past Surgical History:  Procedure Laterality Date  . NO PAST SURGERIES      MEDS:   Current Outpatient Medications on File Prior to Visit  Medication Sig Dispense Refill  . fexofenadine (ALLEGRA) 180 MG tablet Take 180 mg by mouth daily as needed for allergies or rhinitis.    Marland Kitchen LARIN FE 1/20 1-20 MG-MCG tablet Take 1 tablet by mouth daily.     No current facility-administered medications on file prior to visit.    ALLERGIES: Penicillins  Family History  Problem Relation Age  of Onset  . Healthy Mother   . Healthy Father     SH:  Single, non smoker  Review of Systems  All other systems reviewed and are negative.   PHYSICAL EXAMINATION:    BP 127/87   Pulse 99   Ht 5\' 3"  (1.6 m)   Wt 233 lb (105.7 kg)   LMP 06/19/2020   BMI 41.27 kg/m     General appearance: alert, cooperative and appears stated age CV:  Regular rate and rhythm Lungs:  clear to auscultation, no wheezes, rales or rhonchi, symmetric air entry Abdomen: soft, non-tender; bowel sounds normal; no masses,  no organomegaly Lymph:  no inguinal LAD  noted  Pelvic: External genitalia:  no lesions              Urethra:  normal appearing urethra with no masses, tenderness or lesions              Bartholins and Skenes: normal                 Vagina: normal appearing vagina with normal color and discharge, no lesions              Cervix: no lesions              Bimanual Exam:  Uterus:  normal size, contour, position, consistency, mobility, non-tender              Adnexa: no mass, fullness, tenderness  Chaperone, Octaviano Batty, CMA, was present for exam.  Assessment/Plan: 1. Teratoma of right ovary - laparoscopic right teratoma removal with possible right ovary removal will be schedule  2. History of ectopic pregnancy  3. BMI 40.0-44.9, adult ([place])  4. Screening for STD (sexually transmitted disease) - Cytology - PAP( [place])--GC/Chl obtained  5. Cervical cancer screening - Cytology - PAP( [place])

## 2021-03-03 ENCOUNTER — Encounter (HOSPITAL_BASED_OUTPATIENT_CLINIC_OR_DEPARTMENT_OTHER): Payer: Self-pay | Admitting: *Deleted

## 2021-03-03 ENCOUNTER — Telehealth: Payer: Self-pay | Admitting: *Deleted

## 2021-03-03 NOTE — Telephone Encounter (Signed)
Call to patient. Advised surgery is scheduled for, Tuesday, 03-23-21 at 11:30 am and arrive at 9:30 am at [facility] will receive call from pre-op nurse with additional instructions. Letter and hospital brochure will be mailed to patient. Patient requests letter be mailed to address in [place] for summer- [address], [place] [id number]. This is a temporary address only for this summer.   Encounter closed.

## 2021-03-05 ENCOUNTER — Other Ambulatory Visit (HOSPITAL_BASED_OUTPATIENT_CLINIC_OR_DEPARTMENT_OTHER): Payer: Self-pay | Admitting: Obstetrics & Gynecology

## 2021-03-17 NOTE — Pre-Procedure Instructions (Signed)
Surgical Instructions:    Your procedure is scheduled on Tuesday, June 28th (12 Noon- 1:56 PM).  Report to [facility] Main Entrance "A" at 10:00 A.M., then check in with the Admitting office.  Call this number if you have any questions prior to your surgery date, or have problems the morning of surgery:  [phone]    Remember:  Do not eat after midnight the night before your surgery.  You may drink clear liquids until 09:00 AM the morning of your surgery.   Clear liquids allowed are: Water, Non-Citrus Juices (without pulp), Carbonated Beverages, Clear Tea, Black Coffee Only, and Gatorade.    Take these medicines the morning of surgery with A SIP OF WATER:   fexofenadine (ALLEGRA) LARIN FE   IF NEEDED: phenylephrine (NEO-SYNEPHRINE) nasal spray   As of today, STOP taking any Aspirin (unless otherwise instructed by your surgeon) Aleve, Naproxen, Ibuprofen, Motrin, Advil, Goody's, BC's, all herbal medications, fish oil, and all vitamins.             Special instructions:    [place]- Preparing For Surgery  Before surgery, you can play an important role. Because skin is not sterile, your skin needs to be as free of germs as possible. You can reduce the number of germs on your skin by washing with CHG (chlorahexidine gluconate) Soap before surgery.  CHG is an antiseptic cleaner which kills germs and bonds with the skin to continue killing germs even after washing.     Please do not use if you have an allergy to CHG or antibacterial soaps. If your skin becomes reddened/irritated stop using the CHG.  Do not shave (including legs and underarms) for at least 48 hours prior to first CHG shower. It is OK to shave your face.  Please follow these instructions carefully.     Shower the NIGHT BEFORE SURGERY and the MORNING OF SURGERY with CHG Soap.   If you chose to wash your hair, wash your hair first as usual with your normal shampoo. After you shampoo, rinse your hair and body  thoroughly to remove the shampoo.  Then [employer] and genitals (private parts) with your normal soap and rinse thoroughly to remove soap.  After that Use CHG Soap as you would any other liquid soap. You can apply CHG directly to the skin and wash gently with a scrungie or a clean washcloth.   Apply the CHG Soap to your body ONLY FROM THE NECK DOWN.  Do not use on open wounds or open sores. Avoid contact with your eyes, ears, mouth and genitals (private parts). Wash Face and genitals (private parts)  with your normal soap.   Wash thoroughly, paying special attention to the area where your surgery will be performed.  Thoroughly rinse your body with warm water from the neck down.  DO NOT shower/wash with your normal soap after using and rinsing off the CHG Soap.  Pat yourself dry with a CLEAN TOWEL.  Wear CLEAN PAJAMAS to bed the night before surgery  Place CLEAN SHEETS on your bed the night before your surgery  DO NOT SLEEP WITH PETS.   Day of Surgery:  Take a shower with CHG soap. Wear Clean/Comfortable clothing the morning of surgery Do not wear lotions, powders, perfumes, or deodorant.   Remember to brush your teeth WITH YOUR REGULAR TOOTHPASTE. Do not wear jewelry or makeup. DO Not wear nail polish, gel polish, artificial nails, or any other type of covering on natural nails including finger  and toenails. If patients have artificial nails, gel coating, etc. that need to be removed by a nail salon please have this removed prior to surgery or surgery may need to be canceled/delayed if the surgeon/ anesthesia feels like the patient is unable to be adequately monitored. Do not shave 48 hours prior to surgery.   Do not bring valuables to the hospital. [facility] is not responsible for any belongings or valuables.  Do NOT Smoke (Tobacco/Vaping) or drink Alcohol 24 hours prior to your procedure.  If you use a CPAP at night, you may bring all equipment for your overnight stay.    Contacts, glasses, dentures or bridgework may not be worn into surgery, please bring cases for these belongings.   For patients admitted to the hospital, discharge time will be determined by your treatment team.  Patients discharged the day of surgery will not be allowed to drive home, and someone needs to stay with them for 24 hours.  ONLY 1 SUPPORT PERSON MAY BE PRESENT WHILE YOU ARE IN SURGERY. IF YOU ARE TO BE ADMITTED ONCE YOU ARE IN YOUR ROOM YOU WILL BE ALLOWED TWO (2) VISITORS.  Minor children may have two parents present. Special consideration for safety and communication needs will be reviewed on a case by case basis.     Please read over the following fact sheets that you were given.

## 2021-03-18 ENCOUNTER — Encounter (HOSPITAL_COMMUNITY)
Admission: RE | Admit: 2021-03-18 | Discharge: 2021-03-18 | Disposition: A | Discharge status: Home / Self Care | Payer: Medicaid Other | Source: Ambulatory Visit | Attending: Obstetrics & Gynecology | Admitting: Obstetrics & Gynecology

## 2021-03-18 ENCOUNTER — Other Ambulatory Visit: Payer: Self-pay

## 2021-03-18 ENCOUNTER — Encounter (HOSPITAL_COMMUNITY): Payer: Self-pay

## 2021-03-18 DIAGNOSIS — Z01812 Encounter for preprocedural laboratory examination: Secondary | ICD-10-CM | POA: Insufficient documentation

## 2021-03-18 HISTORY — DX: Anxiety disorder, unspecified: F41.9

## 2021-03-18 LAB — CBC
HCT: 43.8 % (ref 36.0–46.0)
Hemoglobin: 14.7 g/dL (ref 12.0–15.0)
MCH: 29.7 pg (ref 26.0–34.0)
MCHC: 33.6 g/dL (ref 30.0–36.0)
MCV: 88.5 fL (ref 80.0–100.0)
Platelets: 225 10*3/uL (ref 150–400)
RBC: 4.95 MIL/uL (ref 3.87–5.11)
RDW: 12.7 % (ref 11.5–15.5)
WBC: 7.2 10*3/uL (ref 4.0–10.5)
nRBC: 0 % (ref 0.0–0.2)

## 2021-03-18 NOTE — Progress Notes (Signed)
PCP - Dr. Sabra Heck currently, no other pcp Cardiologist - denies  PPM/ICD - denies   Chest x-ray - n/a EKG - n/a Stress Test - denies ECHO - denies Cardiac Cath - denies  Sleep Study - denies   No diabetes  As of today, STOP taking any Aspirin (unless otherwise instructed by your surgeon) Aleve, Naproxen, Ibuprofen, Motrin, Advil, Goody's, BC's, all herbal medications, fish oil, and all vitamins.  ERAS Protcol -yes PRE-SURGERY Ensure or G2- no drink ordered  COVID TEST- ambulatory surgery, not needed   Anesthesia review: no  Patient denies shortness of breath, fever, cough and chest pain at PAT appointment   All instructions explained to the patient, with a verbal understanding of the material. Patient agrees to go over the instructions while at home for a better understanding. Patient also instructed to self quarantine after being tested for COVID-19. The opportunity to ask questions was provided.

## 2021-03-23 ENCOUNTER — Encounter (HOSPITAL_COMMUNITY)
Admission: RE | Disposition: A | Discharge status: Home / Self Care | Payer: Self-pay | Source: Home / Self Care | Attending: Obstetrics & Gynecology

## 2021-03-23 ENCOUNTER — Other Ambulatory Visit: Payer: Self-pay

## 2021-03-23 ENCOUNTER — Encounter (HOSPITAL_BASED_OUTPATIENT_CLINIC_OR_DEPARTMENT_OTHER): Payer: Self-pay

## 2021-03-23 ENCOUNTER — Ambulatory Visit (HOSPITAL_COMMUNITY): Payer: Medicaid Other

## 2021-03-23 ENCOUNTER — Ambulatory Visit (HOSPITAL_COMMUNITY)
Admission: RE | Admit: 2021-03-23 | Discharge: 2021-03-23 | Disposition: A | Discharge status: Home / Self Care | Payer: Medicaid Other | Attending: Obstetrics & Gynecology | Admitting: Obstetrics & Gynecology

## 2021-03-23 ENCOUNTER — Encounter (HOSPITAL_COMMUNITY): Payer: Self-pay | Admitting: Obstetrics & Gynecology

## 2021-03-23 DIAGNOSIS — D270 Benign neoplasm of right ovary: Secondary | ICD-10-CM | POA: Diagnosis not present

## 2021-03-23 DIAGNOSIS — Z880 Allergy status to penicillin: Secondary | ICD-10-CM | POA: Insufficient documentation

## 2021-03-23 DIAGNOSIS — Z8759 Personal history of other complications of pregnancy, childbirth and the puerperium: Secondary | ICD-10-CM | POA: Diagnosis not present

## 2021-03-23 HISTORY — PX: LAPAROSCOPIC OVARIAN CYSTECTOMY: SHX6248

## 2021-03-23 LAB — POCT PREGNANCY, URINE: Preg Test, Ur: NEGATIVE

## 2021-03-23 SURGERY — EXCISION, CYST, OVARY, LAPAROSCOPIC
Anesthesia: General | Site: Abdomen | Laterality: Right

## 2021-03-23 MED ORDER — DEXAMETHASONE SODIUM PHOSPHATE 10 MG/ML IJ SOLN
INTRAMUSCULAR | Status: DC | PRN
  Administered 2021-03-23: 5 mg via INTRAVENOUS

## 2021-03-23 MED ORDER — LIDOCAINE 2% (20 MG/ML) 5 ML SYRINGE
INTRAMUSCULAR | Status: AC
  Filled 2021-03-23: qty 5

## 2021-03-23 MED ORDER — SILVER NITRATE-POT NITRATE 75-25 % EX MISC
CUTANEOUS | Status: AC
  Filled 2021-03-23: qty 10

## 2021-03-23 MED ORDER — ONDANSETRON HCL 4 MG/2ML IJ SOLN
INTRAMUSCULAR | Status: AC
  Filled 2021-03-23: qty 2

## 2021-03-23 MED ORDER — ROCURONIUM BROMIDE 10 MG/ML (PF) SYRINGE
PREFILLED_SYRINGE | INTRAVENOUS | Status: AC
  Filled 2021-03-23: qty 10

## 2021-03-23 MED ORDER — PROPOFOL 500 MG/50ML IV EMUL
INTRAVENOUS | Status: DC | PRN
  Administered 2021-03-23: 90 ug/kg/min via INTRAVENOUS

## 2021-03-23 MED ORDER — ORAL CARE MOUTH RINSE: 15.0000 mL | Freq: Once | OROMUCOSAL | Status: AC

## 2021-03-23 MED ORDER — ACETAMINOPHEN 500 MG PO TABS
1000.0000 mg | ORAL_TABLET | Freq: Once | ORAL | Status: AC
  Administered 2021-03-23: 1000 mg via ORAL
  Filled 2021-03-23: qty 2

## 2021-03-23 MED ORDER — FENTANYL CITRATE (PF) 250 MCG/5ML IJ SOLN
INTRAMUSCULAR | Status: DC | PRN
  Administered 2021-03-23 (×4): 50 ug via INTRAVENOUS
  Administered 2021-03-23: 100 ug via INTRAVENOUS
  Administered 2021-03-23 (×2): 50 ug via INTRAVENOUS

## 2021-03-23 MED ORDER — PROPOFOL 10 MG/ML IV BOLUS
INTRAVENOUS | Status: AC
  Filled 2021-03-23: qty 20

## 2021-03-23 MED ORDER — SUGAMMADEX SODIUM 200 MG/2ML IV SOLN
INTRAVENOUS | Status: DC | PRN
  Administered 2021-03-23: 300 mg via INTRAVENOUS

## 2021-03-23 MED ORDER — FENTANYL CITRATE (PF) 250 MCG/5ML IJ SOLN
INTRAMUSCULAR | Status: AC
  Filled 2021-03-23: qty 5

## 2021-03-23 MED ORDER — CHLORHEXIDINE GLUCONATE 0.12 % MT SOLN: 15.0000 mL | Freq: Once | OROMUCOSAL | Status: AC

## 2021-03-23 MED ORDER — DROPERIDOL 2.5 MG/ML IJ SOLN: 0.6250 mg | Freq: Once | INTRAMUSCULAR | Status: DC | PRN

## 2021-03-23 MED ORDER — BUPIVACAINE HCL (PF) 0.25 % IJ SOLN
INTRAMUSCULAR | Status: DC | PRN
  Administered 2021-03-23: 17 mL

## 2021-03-23 MED ORDER — LACTATED RINGERS IV SOLN: INTRAVENOUS | Status: DC | PRN

## 2021-03-23 MED ORDER — 0.9 % SODIUM CHLORIDE (POUR BTL) OPTIME
TOPICAL | Status: DC | PRN
  Administered 2021-03-23: 1000 mL

## 2021-03-23 MED ORDER — MIDAZOLAM HCL 2 MG/2ML IJ SOLN
INTRAMUSCULAR | Status: AC
  Filled 2021-03-23: qty 2

## 2021-03-23 MED ORDER — SCOPOLAMINE 1 MG/3DAYS TD PT72
1.0000 | MEDICATED_PATCH | TRANSDERMAL | Status: DC
  Administered 2021-03-23: 1.5 mg via TRANSDERMAL
  Filled 2021-03-23: qty 1

## 2021-03-23 MED ORDER — KETAMINE HCL 10 MG/ML IJ SOLN
INTRAMUSCULAR | Status: DC | PRN
  Administered 2021-03-23: 20 mg via INTRAVENOUS

## 2021-03-23 MED ORDER — KETAMINE HCL 50 MG/5ML IJ SOSY
PREFILLED_SYRINGE | INTRAMUSCULAR | Status: AC
  Filled 2021-03-23: qty 5

## 2021-03-23 MED ORDER — LIDOCAINE 2% (20 MG/ML) 5 ML SYRINGE
INTRAMUSCULAR | Status: DC | PRN
  Administered 2021-03-23: 100 mg via INTRAVENOUS

## 2021-03-23 MED ORDER — PROPOFOL 10 MG/ML IV BOLUS
INTRAVENOUS | Status: DC | PRN
  Administered 2021-03-23: 250 mg via INTRAVENOUS

## 2021-03-23 MED ORDER — ONDANSETRON HCL 4 MG/2ML IJ SOLN
INTRAMUSCULAR | Status: DC | PRN
  Administered 2021-03-23: 4 mg via INTRAVENOUS

## 2021-03-23 MED ORDER — KETOROLAC TROMETHAMINE 30 MG/ML IJ SOLN
INTRAMUSCULAR | Status: DC | PRN
  Administered 2021-03-23: 30 mg via INTRAVENOUS

## 2021-03-23 MED ORDER — POVIDONE-IODINE 10 % EX SWAB
2.0000 "application " | Freq: Once | CUTANEOUS | Status: AC
  Administered 2021-03-23: 2 via TOPICAL

## 2021-03-23 MED ORDER — HYDROMORPHONE HCL 1 MG/ML IJ SOLN: 0.2500 mg | INTRAMUSCULAR | Status: DC | PRN

## 2021-03-23 MED ORDER — DIPHENHYDRAMINE HCL 50 MG/ML IJ SOLN
INTRAMUSCULAR | Status: AC
  Filled 2021-03-23: qty 1

## 2021-03-23 MED ORDER — BUPIVACAINE HCL (PF) 0.25 % IJ SOLN
INTRAMUSCULAR | Status: AC
  Filled 2021-03-23: qty 30

## 2021-03-23 MED ORDER — HYDROCODONE-ACETAMINOPHEN 5-325 MG PO TABS: 1.0000 | ORAL_TABLET | ORAL | 0 refills | Status: AC | PRN

## 2021-03-23 MED ORDER — DEXAMETHASONE SODIUM PHOSPHATE 10 MG/ML IJ SOLN
INTRAMUSCULAR | Status: AC
  Filled 2021-03-23: qty 1

## 2021-03-23 MED ORDER — DEXMEDETOMIDINE (PRECEDEX) IN NS 20 MCG/5ML (4 MCG/ML) IV SYRINGE
PREFILLED_SYRINGE | INTRAVENOUS | Status: DC | PRN
  Administered 2021-03-23 (×2): 8 ug via INTRAVENOUS
  Administered 2021-03-23: 4 ug via INTRAVENOUS

## 2021-03-23 MED ORDER — MEPERIDINE HCL 25 MG/ML IJ SOLN: 6.2500 mg | INTRAMUSCULAR | Status: DC | PRN

## 2021-03-23 MED ORDER — MIDAZOLAM HCL 5 MG/5ML IJ SOLN
INTRAMUSCULAR | Status: DC | PRN
  Administered 2021-03-23: 2 mg via INTRAVENOUS

## 2021-03-23 MED ORDER — ROCURONIUM BROMIDE 10 MG/ML (PF) SYRINGE
PREFILLED_SYRINGE | INTRAVENOUS | Status: DC | PRN
  Administered 2021-03-23: 70 mg via INTRAVENOUS
  Administered 2021-03-23: 20 mg via INTRAVENOUS

## 2021-03-23 MED ORDER — LACTATED RINGERS IV SOLN: INTRAVENOUS | Status: DC

## 2021-03-23 MED ORDER — CHLORHEXIDINE GLUCONATE 0.12 % MT SOLN
OROMUCOSAL | Status: AC
  Administered 2021-03-23: 15 mL via OROMUCOSAL
  Filled 2021-03-23: qty 15

## 2021-03-23 MED ORDER — IBUPROFEN 800 MG PO TABS: 800.0000 mg | ORAL_TABLET | Freq: Three times a day (TID) | ORAL | 0 refills | Status: AC | PRN

## 2021-03-23 SURGICAL SUPPLY — 41 items
APPLICATOR COTTON TIP 6 STRL (MISCELLANEOUS) ×1 IMPLANT
APPLICATOR COTTON TIP 6IN STRL (MISCELLANEOUS) ×3
BARRIER ADHS 3X4 INTERCEED (GAUZE/BANDAGES/DRESSINGS) ×3 IMPLANT
BENZOIN TINCTURE PRP APPL 2/3 (GAUZE/BANDAGES/DRESSINGS) IMPLANT
CABLE HIGH FREQUENCY MONO STRZ (ELECTRODE) ×3 IMPLANT
CATH ROBINSON RED A/P 16FR (CATHETERS) IMPLANT
CLOSURE WOUND 1/2 X4 (GAUZE/BANDAGES/DRESSINGS)
DERMABOND ADVANCED (GAUZE/BANDAGES/DRESSINGS) ×2
DERMABOND ADVANCED .7 DNX12 (GAUZE/BANDAGES/DRESSINGS) ×1 IMPLANT
DRSG OPSITE POSTOP 3X4 (GAUZE/BANDAGES/DRESSINGS) IMPLANT
DURAPREP 26ML APPLICATOR (WOUND CARE) ×3 IMPLANT
FORCEPS CUTTING 33CM 5MM (CUTTING FORCEPS) IMPLANT
GLOVE SURG LTX SZ6.5 (GLOVE) ×3 IMPLANT
GLOVE SURG UNDER POLY LF SZ7 (GLOVE) ×12 IMPLANT
GOWN STRL REUS W/ TWL LRG LVL3 (GOWN DISPOSABLE) ×1 IMPLANT
GOWN STRL REUS W/TWL LRG LVL3 (GOWN DISPOSABLE) ×2
KIT TURNOVER KIT B (KITS) ×3 IMPLANT
LIGASURE VESSEL 5MM BLUNT TIP (ELECTROSURGICAL) IMPLANT
NEEDLE INSUFFLATION 14GA 120MM (NEEDLE) ×3 IMPLANT
PACK LAPAROSCOPY BASIN (CUSTOM PROCEDURE TRAY) ×3 IMPLANT
PACK TRENDGUARD 450 HYBRID PRO (MISCELLANEOUS) IMPLANT
POUCH LAPAROSCOPIC INSTRUMENT (MISCELLANEOUS) ×3 IMPLANT
POUCH SPECIMEN RETRIEVAL 10MM (ENDOMECHANICALS) ×3 IMPLANT
PROTECTOR NERVE ULNAR (MISCELLANEOUS) ×6 IMPLANT
SET IRRIG TUBING LAPAROSCOPIC (IRRIGATION / IRRIGATOR) ×3 IMPLANT
SET TUBE SMOKE EVAC HIGH FLOW (TUBING) ×3 IMPLANT
SHEARS HARMONIC ACE PLUS 36CM (ENDOMECHANICALS) ×3 IMPLANT
SLEEVE ADV FIXATION 5X100MM (TROCAR) IMPLANT
SLEEVE ENDOPATH XCEL 5M (ENDOMECHANICALS) ×6 IMPLANT
STRIP CLOSURE SKIN 1/2X4 (GAUZE/BANDAGES/DRESSINGS) IMPLANT
SUT VICRYL 0 UR6 27IN ABS (SUTURE) IMPLANT
SUT VICRYL 4-0 PS2 18IN ABS (SUTURE) ×3 IMPLANT
SYR 30ML LL (SYRINGE) IMPLANT
SYSTEM CARTER THOMASON II (TROCAR) ×3 IMPLANT
TOWEL GREEN STERILE FF (TOWEL DISPOSABLE) ×6 IMPLANT
TRAY FOLEY W/BAG SLVR 14FR (SET/KITS/TRAYS/PACK) ×3 IMPLANT
TRENDGUARD 450 HYBRID PRO PACK (MISCELLANEOUS)
TROCAR ADV FIXATION 5X100MM (TROCAR) ×3 IMPLANT
TROCAR XCEL NON-BLD 11X100MML (ENDOMECHANICALS) ×3 IMPLANT
TROCAR XCEL NON-BLD 5MMX100MML (ENDOMECHANICALS) ×3 IMPLANT
WARMER LAPAROSCOPE (MISCELLANEOUS) ×3 IMPLANT

## 2021-03-23 NOTE — Anesthesia Preprocedure Evaluation (Addendum)
Anesthesia Evaluation  Patient identified by MRN, date of birth, ID band Patient awake    Reviewed: Allergy & Precautions, NPO status , Patient's Chart, lab work & pertinent test results  Airway Mallampati: II  TM Distance: >3 FB Neck ROM: Full    Dental no notable dental hx. (+) Dental Advisory Given, Teeth Intact   Pulmonary neg pulmonary ROS,    Pulmonary exam normal breath sounds clear to auscultation       Cardiovascular negative cardio ROS Normal cardiovascular exam Rhythm:Regular Rate:Normal     Neuro/Psych PSYCHIATRIC DISORDERS Anxiety negative neurological ROS     GI/Hepatic negative GI ROS, Neg liver ROS,   Endo/Other  Morbid obesity  Renal/GU negative Renal ROS     Musculoskeletal negative musculoskeletal ROS (+)   Abdominal (+) + obese,   Peds  Hematology negative hematology ROS (+)   Anesthesia Other Findings   Reproductive/Obstetrics                            Anesthesia Physical Anesthesia Plan  ASA: 3  Anesthesia Plan: General   Post-op Pain Management:    Induction: Intravenous  PONV Risk Score and Plan: 4 or greater and Ondansetron, Dexamethasone, Treatment may vary due to age or medical condition, Scopolamine patch - Pre-op and Midazolam  Airway Management Planned: Oral ETT  Additional Equipment: None  Intra-op Plan:   Post-operative Plan: Extubation in OR  Informed Consent: I have reviewed the patients History and Physical, chart, labs and discussed the procedure including the risks, benefits and alternatives for the proposed anesthesia with the patient or authorized representative who has indicated his/her understanding and acceptance.     Dental advisory given  Plan Discussed with: CRNA  Anesthesia Plan Comments:        Anesthesia Quick Evaluation

## 2021-03-23 NOTE — Transfer of Care (Signed)
Immediate Anesthesia Transfer of Care Note  Patient: Amy Lynch  Procedure(s) Performed: LAPAROSCOPIC right OVARIAN dermoid removal (Right: Abdomen)  Patient Location: PACU  Anesthesia Type:General  Level of Consciousness: awake and alert   Airway & Oxygen Therapy: Patient Spontanous Breathing and Patient connected to face mask oxygen  Post-op Assessment: Report given to RN and Post -op Vital signs reviewed and stable  Post vital signs: Reviewed and stable  Last Vitals:  Vitals Value Taken Time  BP 112/54 03/23/21 1320  Temp    Pulse 88 03/23/21 1322  Resp 19 03/23/21 1322  SpO2 100 % 03/23/21 1322  Vitals shown include unvalidated device data.  Last Pain:  Vitals:   03/23/21 1112  TempSrc:   PainSc: 0-No pain      Patients Stated Pain Goal: 0 (50/03/70 4888)  Complications: No notable events documented.

## 2021-03-23 NOTE — Op Note (Signed)
03/23/2021  1:46 PM  PATIENT:  Amy Lynch  20 y.o. female  PRE-OPERATIVE DIAGNOSIS:  Right ovarian teratoma  POST-OPERATIVE DIAGNOSIS:  Right ovarian teratoma  PROCEDURE:  Procedure(s): LAPAROSCOPIC right OVARIAN dermoid removal  SURGEON:  Megan Salon  ASSISTANTS: Hebert Soho, MD.  An experienced assistant was required given the standard of surgical care given the complexity of the case.  This assistant was needed for exposure, dissection, suctioning, retraction, instrument exchange and for overall help during the procedure.  RNFA help was also unavailable.  ANESTHESIA:   general  ESTIMATED BLOOD LOSS: 25 mL  BLOOD ADMINISTERED:none   FLUIDS: 1600cc LR  UOP: 300cc clear uOPD  SPECIMEN:  right ovarian cyst and cyst contents, cytologic washing  DISPOSITION OF SPECIMEN:  PATHOLOGY  FINDINGS: normal upper abdomen, right enlarged ovary c/w dermoid noted on MRI, normal left ovary and tube,  right tube with small narrowing in the isthmic region of the tube, normal uterus  DESCRIPTION OF OPERATION: Patient is taken to the operating room. She is placed in the supine position. She is a running IV in place. Informed consent was present on the chart. SCDs on her lower extremities and functioning properly. Patient was positioned while she was awake.  Her legs were placed in the low lithotomy position in [place]. Her arms were tucked by the side.  General endotracheal anesthesia was administered by the anesthesia staff without difficulty. Dr. Lissa Hoard, anesthesia, oversaw case.  Time out performed.    Chlora prep was then used to prep the abdomen and Betadine was used to prep the inner thighs, perineum and vagina. Once 3 minutes had past the patient was draped in a normal standard fashion. The legs were lifted to the high lithotomy position. The cervix was visualized by placing an open sided graves speculum in the vagina. The anterior lip of the cervix was grasped with  single-tooth tenaculum.  A hulka clamp was passed into the cervical os and attached the anterior lip of the cervix.  The tenaculum was removed.  Speculum was removed.  A Foley catheter was placed to straight drain.  Clear urine was noted. Legs were lowered to the low lithotomy position and attention was turned the abdomen.  The umbilicus was everted.  A Veress needle was obtained. Syringe of sterile saline was placed on a open Veress needle.  This was passed into the umbilicus until just when the fluid started to drip.  Then low flow CO2 gas was attached the needle but higher pressures were noted.  Veress needle was removed and this was attempted again with the same finding.  Decision made to place  a LUQ port placement.  OG tube placed by anesthesia.  Marcaine 0.25% used to anesthetize the skin.  Skin incision made with #11 blade.  A 5 millimeter non-bladed Optiview trocar and port were passed directly to the abdomen.  Then low flow of C02 gas was initiated.  Pressures were still at about 69mmHg.  Pneumoperitoneum was achieved.  I feel I was likely in the correct location with the umbilical Veress placement as pressured were about the same.  There was no evidence of bowel injury.  Pt was placed in trendelenburg positioning.  A 73mm non bladed trochar and port were placed in the umbilicus and then a second 65mm port was placed in the LLQ and a 55mm port placed in the RLQ.    Pelvis survey.  Enlarged right ovary identified.  Washing obtained.  Left ovary normal.  Using  a harmonic scalpel, the ovary was incised to identify the dermoid cyst.  The cyst was grasped and teased free from the ovary.  It did rupture with spill of contents. Once cyst was fully freed from ovary, this was placed in a surgical bag and removed.  Pelvic was irrigated copiously to removed and fat that escaped from the dermoid cyst.  No bleeding was noted.  Interceed was placed around the ovary to prevent adhesions.  Due to size of RLQ incision,  Eulas Post Tommason device was used to close this incision.  Port was removed prior to closing the RLQ incision.    At this point, procedure was ended.  LUQ port remoted.  Instruments were removed.  Pneumoperitoneum was relieved.  Several deep breaths were given by anesthesia to try and remove any CO2 gas.  The final two ports were removed.  The patient was taken out of Trendelenburg positioning.  Several deep breaths were given to the patient's trying to any gas the abdomen and finally the midline port was removed.  The skin was then closed with subcuticular stitches of 3-0 Vicryl. The skin was cleansed Dermabond was applied. Attention was then turned the vagina and the Hulka clamp removed.  Minimal bleeding was removed.  Foley was removed. Sponge, lap, needle, initially counts were correct x2. Patient tolerated the procedure very well. She was awakened from anesthesia, extubated and taken to recovery in stable condition.   COUNTS:  YES  PLAN OF CARE: Transfer to PACU

## 2021-03-23 NOTE — H&P (Signed)
Amy Lynch is an 20 y.o. female G1 ectopic 1 SWF with right probable ovarian dermoid here for possible dermoid removal and possible right ovary removal.  Pt did have MRI on 12/11/2020 showing 7.2 x 4.9 x 4.8cm with a 4.9 x 4.4 x 4.6cm mass.  Procedure, risks and benefits have been discussed.  She is here and ready to proceed.  Pertinent Gynecological History: Menses: flow is moderate Bleeding: regular Contraception: OCP (estrogen/progesterone) DES exposure: denies Blood transfusions: none Sexually transmitted diseases: no past history Previous GYN Procedures:  MTX treatment for ectopic rpegnancy   Last mammogram: n/a Last pap: normal Date: 02/2021 OB History: G1, P0. Ectopic 1   Menstrual History: Patient's last menstrual period was 06/19/2020.    Past Medical History:  Diagnosis Date   Anxiety    Ectopic pregnancy 2021    Past Surgical History:  Procedure Laterality Date   NO PAST SURGERIES      Family History  Problem Relation Age of Onset   Healthy Mother    Healthy Father     Social History:  reports that she has never smoked. She has never used smokeless tobacco. She reports current alcohol use. She reports that she does not use drugs.  Allergies:  Allergies  Allergen Reactions   Penicillins Hives    Medications Prior to Admission  Medication Sig Dispense Refill Last Dose   fexofenadine (ALLEGRA) 180 MG tablet Take 180 mg by mouth daily.      LARIN FE 1/20 1-20 MG-MCG tablet Take 1 tablet by mouth daily.      Multiple Vitamins-Minerals (MULTIVITAMIN WITH MINERALS) tablet Take 1 tablet by mouth daily.      phenylephrine (NEO-SYNEPHRINE) 1 % nasal spray Place 1 drop into both nostrils every 6 (six) hours as needed for congestion.       Review of Systems  All other systems reviewed and are negative.  Blood pressure (!) 119/59, pulse 78, temperature 98.3 F (36.8 C), temperature source Oral, resp. rate 18, height 5\' 2"  (1.575 m), weight 106.6 kg, last  menstrual period 06/19/2020, SpO2 96 %. Physical Exam Constitutional:      Appearance: Normal appearance.  Cardiovascular:     Rate and Rhythm: Normal rate and regular rhythm.  Pulmonary:     Effort: Pulmonary effort is normal.     Breath sounds: Normal breath sounds.  Skin:    General: Skin is warm.  Neurological:     General: No focal deficit present.     Mental Status: She is alert.  Psychiatric:        Mood and Affect: Mood normal.    No results found for this or any previous visit (from the past 24 hour(s)).  No results found.  Assessment/Plan: 20 yo G1Ecotpic 1 SWF with right ovarian dermoid here for possible resection of dermoid, possible right salpingectomy, possible RSO.  Procedure reviewed.  Questions answered.    Megan Salon 03/23/2021, 10:49 AM

## 2021-03-23 NOTE — Discharge Instructions (Signed)
Post-surgical Instructions, Outpatient Surgery  You may expect to feel dizzy, weak, and drowsy for as long as 24 hours after receiving the medicine that made you sleep (anesthetic). For the first 24 hours after your surgery:   Do not drive a car, ride a bicycle, participate in physical activities, or take public transportation until you are done taking narcotic pain medicines or as directed by Dr. Sabra Heck.  Do not drink alcohol or take tranquilizers.  Do not take medicine that has not been prescribed by your physicians.  Do not sign important papers or make important decisions while on narcotic pain medicines.  Have a responsible person with you.   PAIN MANAGEMENT Motrin 800mg .  (This is the same as 4-200mg  over the counter tablets of Motrin or ibuprofen.)  You may take this every eight hours or as needed for cramping.   Vicodin 5/325mg .  For more severe pain, take one tablet every four hours as needed for pain control.  (Remember that narcotic pain medications increase your risk of constipation.  If this becomes a problem, you may take an over the counter stool softener like Colace 100mg  up to four times a day.)  DO'S AND DON'T'S Do not take a tub bath for one week.  You may shower on the first day after your surgery Do not do any heavy lifting for one to two weeks.  This increases the chance of bleeding. Do move around as you feel able.  Stairs are fine.  You may begin to exercise again as you feel able.  Do not lift any weights for two weeks. Do not put anything in the vagina for two weeks--no tampons, intercourse, or douching.    REGULAR MEDIATIONS/VITAMINS: You may restart all of your regular medications as prescribed. You may restart all of your vitamins as you normally take them.    PLEASE CALL OR SEEK MEDICAL CARE IF: You have persistent nausea and vomiting.  You have trouble eating or drinking.  You have an oral temperature above 100.5.  You have constipation that is not helped by  adjusting diet or increasing fluid intake. Pain medicines are a common cause of constipation.  You have heavy vaginal bleeding

## 2021-03-23 NOTE — Anesthesia Procedure Notes (Signed)
Procedure Name: Intubation Date/Time: 03/23/2021 11:26 AM Performed by: Nolon Nations, MD Pre-anesthesia Checklist: Patient identified, Emergency Drugs available, Suction available and Patient being monitored Patient Re-evaluated:Patient Re-evaluated prior to induction Oxygen Delivery Method: Circle system utilized Preoxygenation: Pre-oxygenation with 100% oxygen Induction Type: IV induction Ventilation: Mask ventilation without difficulty Laryngoscope Size: 3 and Mac Grade View: Grade II Tube type: Oral Tube size: 7.0 mm Number of attempts: 1 Airway Equipment and Method: Stylet and Oral airway Placement Confirmation: ETT inserted through vocal cords under direct vision, positive ETCO2 and breath sounds checked- equal and bilateral Secured at: 22 cm Tube secured with: Tape Dental Injury: Teeth and Oropharynx as per pre-operative assessment

## 2021-03-23 NOTE — Anesthesia Postprocedure Evaluation (Signed)
Anesthesia Post Note  Patient: Amy Lynch  Procedure(s) Performed: LAPAROSCOPIC right OVARIAN dermoid removal (Right: Abdomen)     Patient location during evaluation: PACU Anesthesia Type: General Level of consciousness: sedated and patient cooperative Pain management: pain level controlled Vital Signs Assessment: post-procedure vital signs reviewed and stable Respiratory status: spontaneous breathing Cardiovascular status: stable Anesthetic complications: no   No notable events documented.  Last Vitals:  Vitals:   03/23/21 1335 03/23/21 1405  BP: (!) 96/51 110/62  Pulse: 76 94  Resp: 14 11  Temp:  36.8 C  SpO2: 97% 97%    Last Pain:  Vitals:   03/23/21 1405  TempSrc:   PainSc: [place]

## 2021-03-24 ENCOUNTER — Encounter (HOSPITAL_BASED_OUTPATIENT_CLINIC_OR_DEPARTMENT_OTHER): Payer: Self-pay | Admitting: Obstetrics & Gynecology

## 2021-03-24 ENCOUNTER — Encounter (HOSPITAL_COMMUNITY): Payer: Self-pay | Admitting: Obstetrics & Gynecology

## 2021-03-24 LAB — CYTOLOGY - NON PAP

## 2021-03-24 LAB — SURGICAL PATHOLOGY

## 2021-04-05 ENCOUNTER — Telehealth (INDEPENDENT_AMBULATORY_CARE_PROVIDER_SITE_OTHER): Payer: Medicaid Other | Admitting: Obstetrics & Gynecology

## 2021-04-05 ENCOUNTER — Encounter (HOSPITAL_BASED_OUTPATIENT_CLINIC_OR_DEPARTMENT_OTHER): Payer: Self-pay | Admitting: Obstetrics & Gynecology

## 2021-04-05 DIAGNOSIS — Z09 Encounter for follow-up examination after completed treatment for conditions other than malignant neoplasm: Secondary | ICD-10-CM

## 2021-04-05 DIAGNOSIS — D270 Benign neoplasm of right ovary: Secondary | ICD-10-CM | POA: Diagnosis not present

## 2021-04-05 NOTE — Progress Notes (Signed)
Virtual Visit via Video Note  I connected with Amy Lynch on 04/05/21 at  3:15 PM EDT by a video enabled telemedicine application and verified that I am speaking with the correct person using two identifiers.  Location: Patient: home Provider: office   I discussed the limitations of evaluation and management by telemedicine and the availability of in person appointments. The patient expressed understanding and agreed to proceed.  History of Present Illness: 20 you G1A1 SWF for post op visit after undergoing laparoscopic right ovarian dermoid removal on 03/23/2021.  Reports she is doing well.  Took pain medication for about 7 days and did have some significant constipation.  Hasn't taken anything for pain in over a week.  Bowel movements are now normal.  Voiding is normal.  Pt is on OCPs and cycle should start this week.  Denies irregular bleeding since right after the procedure.   Observations/Objective: WNWD WF NAD Incisions:  C/D/I  Assessment and Plan: 20 yo s/p right dermoid resection, doing well  Follow Up Instructions: Restrictions reviewed with pt Will need AEX in 1 year   I discussed the assessment and treatment plan with the patient. The patient was provided an opportunity to ask questions and all were answered. The patient agreed with the plan and demonstrated an understanding of the instructions.   The patient was advised to call back or seek an in-person evaluation if the symptoms worsen or if the condition fails to improve as anticipated.   Megan Salon, MD

## 2021-04-13 ENCOUNTER — Encounter (HOSPITAL_BASED_OUTPATIENT_CLINIC_OR_DEPARTMENT_OTHER): Payer: Self-pay

## 2021-04-14 ENCOUNTER — Telehealth (HOSPITAL_BASED_OUTPATIENT_CLINIC_OR_DEPARTMENT_OTHER): Payer: Medicaid Other | Admitting: Obstetrics & Gynecology

## 2021-04-27 ENCOUNTER — Encounter (HOSPITAL_BASED_OUTPATIENT_CLINIC_OR_DEPARTMENT_OTHER): Payer: Self-pay

## 2021-04-27 ENCOUNTER — Encounter: Payer: Self-pay | Admitting: Obstetrics and Gynecology

## 2021-04-27 ENCOUNTER — Telehealth: Payer: Self-pay | Admitting: Obstetrics and Gynecology

## 2021-04-27 DIAGNOSIS — L089 Local infection of the skin and subcutaneous tissue, unspecified: Secondary | ICD-10-CM

## 2021-04-27 MED ORDER — SULFAMETHOXAZOLE-TRIMETHOPRIM 800-160 MG PO TABS
1.0000 | ORAL_TABLET | Freq: Two times a day (BID) | ORAL | 0 refills | Status: DC
Start: 2021-04-27 — End: 2021-05-03

## 2021-04-27 NOTE — Telephone Encounter (Signed)
Patient sent message about pus coming from incision. She is s/p laparoscopic right ovarian dermoid cyst removal on 6/28. She states that one of her incisions was swelling and she "popped" it open. Blood came out with some pus. She put a bandaid over it. It looks less red and swollen now. She is currently in [place] at [employer] camp and cannot come to the office.   Will send antibiotics. Bactrim DS BID sent to [facility] in pharmacy. Advised that if it doesn't improve then she will need to come in for a visit next week.   Jaquita Folds, MD

## 2021-04-29 ENCOUNTER — Encounter (HOSPITAL_BASED_OUTPATIENT_CLINIC_OR_DEPARTMENT_OTHER): Payer: Self-pay

## 2021-05-03 ENCOUNTER — Encounter (HOSPITAL_BASED_OUTPATIENT_CLINIC_OR_DEPARTMENT_OTHER): Payer: Self-pay | Admitting: Obstetrics & Gynecology

## 2021-05-03 ENCOUNTER — Telehealth (INDEPENDENT_AMBULATORY_CARE_PROVIDER_SITE_OTHER): Payer: Medicaid Other | Admitting: Obstetrics & Gynecology

## 2021-05-03 DIAGNOSIS — N926 Irregular menstruation, unspecified: Secondary | ICD-10-CM | POA: Diagnosis not present

## 2021-05-03 DIAGNOSIS — R4586 Emotional lability: Secondary | ICD-10-CM

## 2021-05-03 DIAGNOSIS — Z3041 Encounter for surveillance of contraceptive pills: Secondary | ICD-10-CM | POA: Diagnosis not present

## 2021-05-03 MED ORDER — NORETHIN ACE-ETH ESTRAD-FE 1.5-30 MG-MCG PO TABS: 1.0000 | ORAL_TABLET | Freq: Every day | ORAL | 1 refills | Status: DC

## 2021-05-03 NOTE — Progress Notes (Signed)
Virtual Visit via Video Note  I connected with Amy Lynch on 05/03/21 at  1:45 PM EDT by a video enabled telemedicine application and verified that I am speaking with the correct person using two identifiers.  Location: Patient: home Provider: office   I discussed the limitations of evaluation and management by telemedicine and the availability of in person appointments. The patient expressed understanding and agreed to proceed.  History of Present Illness: 20 yo G1A1 SWF for discussion of irregular cycles with her current form of contraception.  She's been on Larin Fe since October.  She does have a monthly cycle but often her cycle occurs during the third week of the pill pack and then stops during the placebo week.  She's also having cramping and overall body aches, particularly breat pain, and mood swings.  She had not been birth control before that time so hasn't taken any other pill than this one.  Does like the idea of taking a pill and not another form of contraception.     Observations/Objective: WNWD WF, NAD  Assessment and Plan: 20 G1A1 SWF with irregular cycles, desires contraception with OCPs.  1. Irregular bleeding - discussed with pt changing OCPs and increasing estrogen dosage for cycle control.  Side effects reviewed.  She will monitor specifically for any increased headaches or nausea - norethindrone-ethinyl estradiol-iron (LARIN FE 1.5/30) 1.5-30 MG-MCG tablet; Take 1 tablet by mouth daily.  Dispense: 84 tablet; Refill: 1  2. Mood swings - pt may end up benefiting from changing progesterone to drosperinone.  Will see how she does with current change before making another adjustment.  Pt will give update in about 3 months.  3. Encounter for surveillance of contraceptive pills   Follow Up Instructions: I discussed the assessment and treatment plan with the patient. The patient was provided an opportunity to ask questions and all were answered. The patient agreed  with the plan and demonstrated an understanding of the instructions.   The patient was advised to call back or seek an in-person evaluation if the symptoms worsen or if the condition fails to improve as anticipated.  I provided 21 minutes of non-face-to-face time during this encounter.   Megan Salon, MD

## 2021-06-07 ENCOUNTER — Encounter (HOSPITAL_BASED_OUTPATIENT_CLINIC_OR_DEPARTMENT_OTHER): Payer: Self-pay

## 2021-07-07 ENCOUNTER — Encounter (HOSPITAL_BASED_OUTPATIENT_CLINIC_OR_DEPARTMENT_OTHER): Payer: Self-pay

## 2021-07-18 IMAGING — US US PELVIS COMPLETE TRANSABD/TRANSVAG W DUPLEX
1 series · 13 of 25 positions shown · non-contrast
Comparison: Current unenhanced abdomen and pelvis CT. Ob
ultrasound, 07/15/2020

CLINICAL DATA: Pelvic pain. History of an ectopic pregnancy treated
with surgery. History of ovarian cysts.

EXAM:
TRANSABDOMINAL AND TRANSVAGINAL ULTRASOUND OF PELVIS
DOPPLER ULTRASOUND OF OVARIES
TECHNIQUE: Both transabdominal and transvaginal ultrasound examinations of the
pelvis were performed. Transabdominal technique was performed for
global imaging of the pelvis including uterus, ovaries, adnexal
regions, and pelvic cul-de-sac.
It was necessary to proceed with endovaginal exam following the
transabdominal exam to visualize the uterus, endometrium and ovaries
to better advantage. Color and duplex Doppler ultrasound was
utilized to evaluate blood flow to the ovaries.

[Series 1: us pelvis complete transabd/transvag w duplex · 13 of 128 slices shown]
[im 1/128]
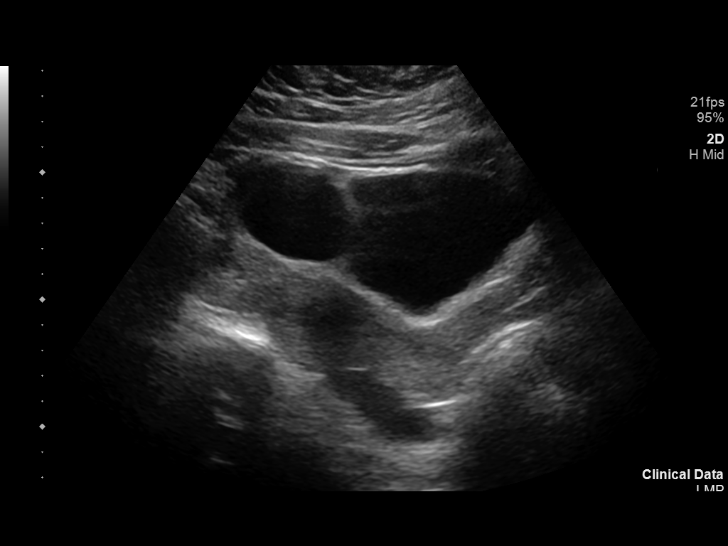
[im 11/128]
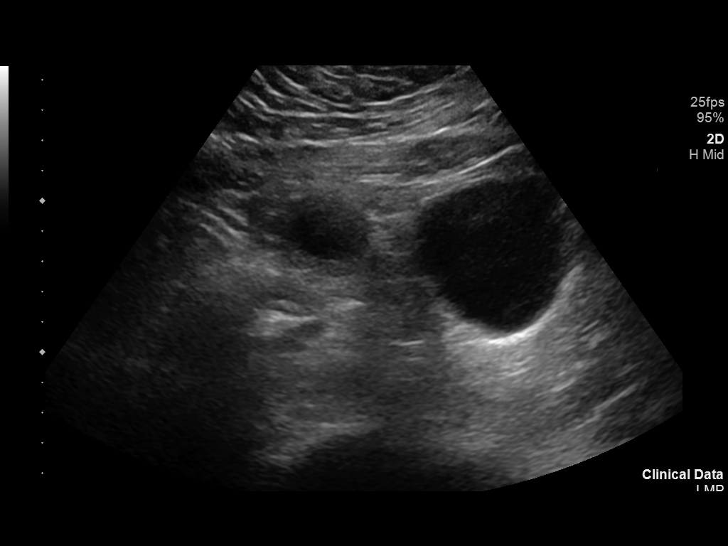
[im 22/128]
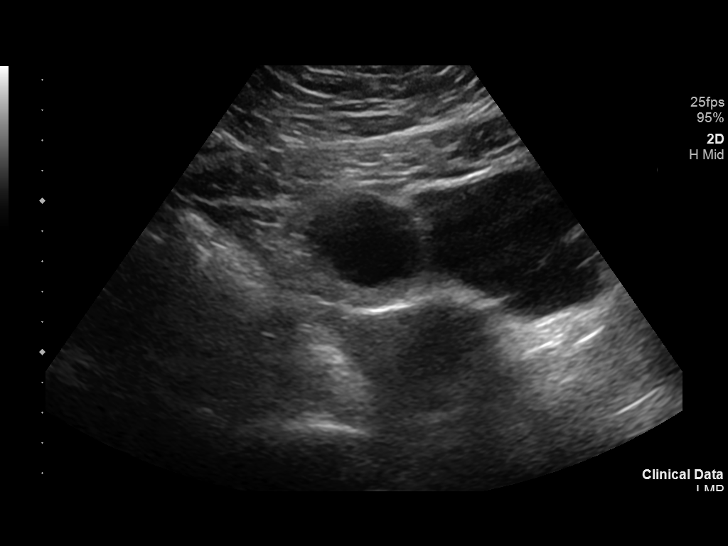
[im 32/128]
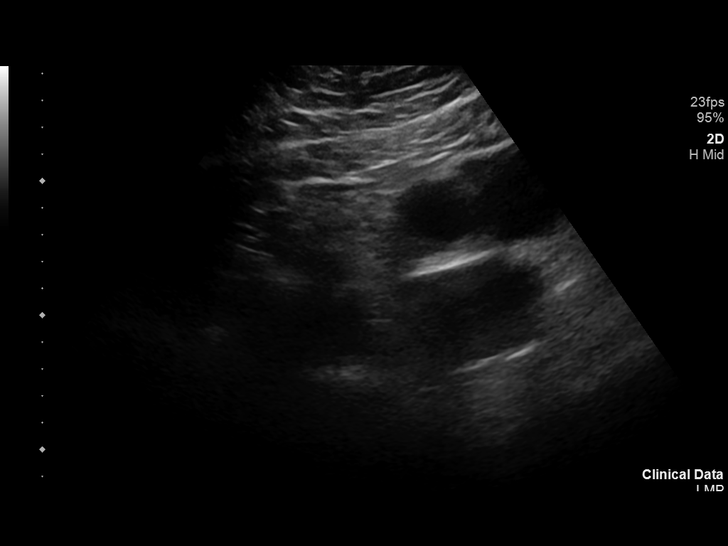
[im 43/128]
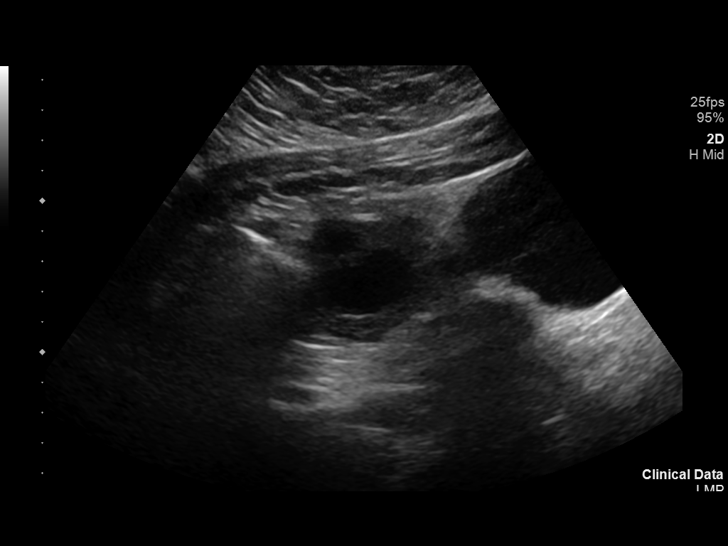
[im 53/128]
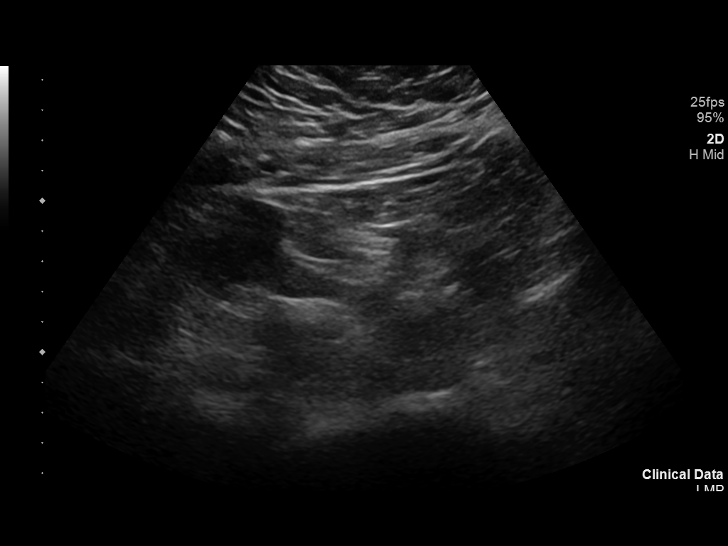
[im 64/128]
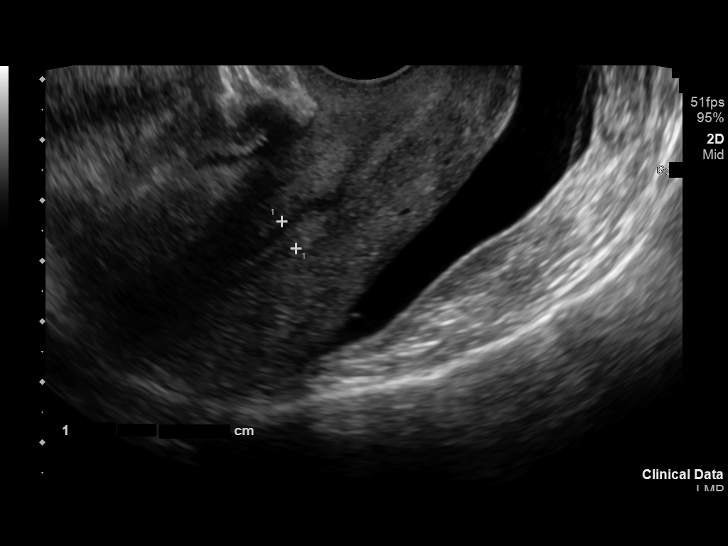
[im 75/128]
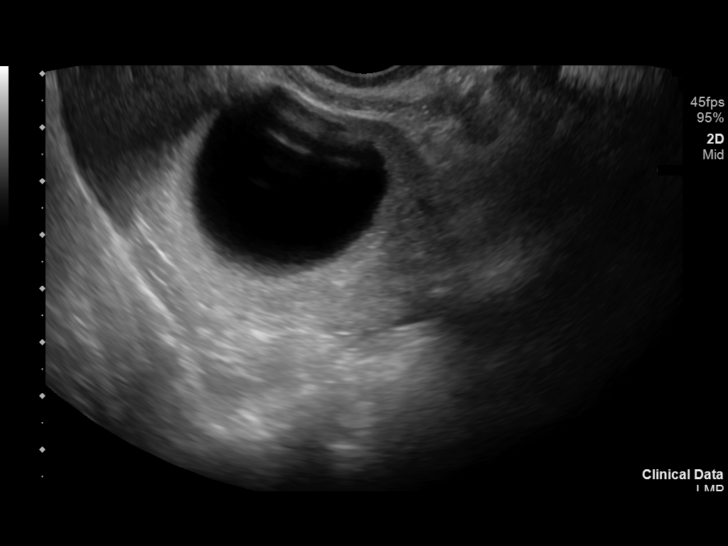
[im 85/128]
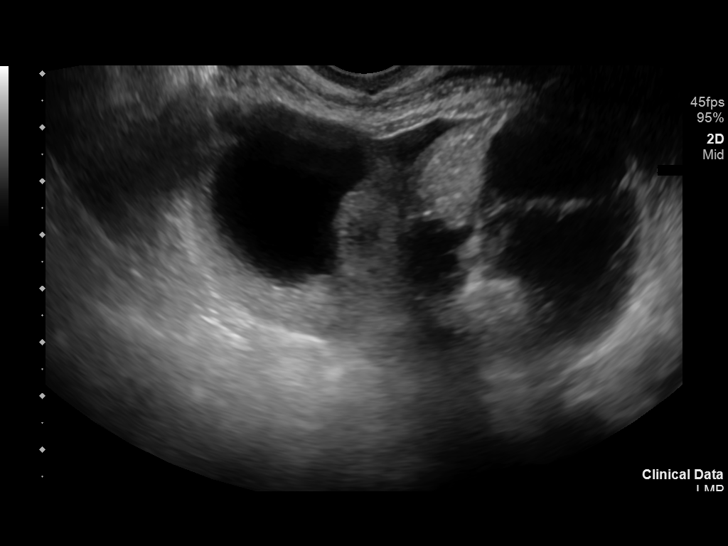
[im 96/128]
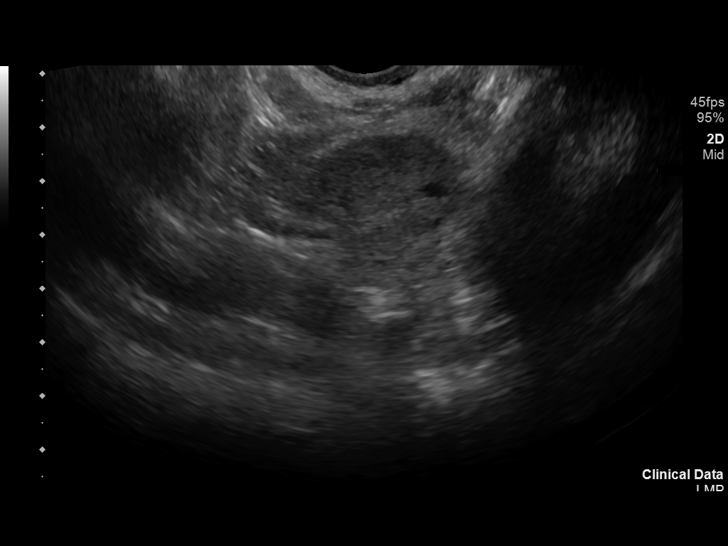
[im 106/128]
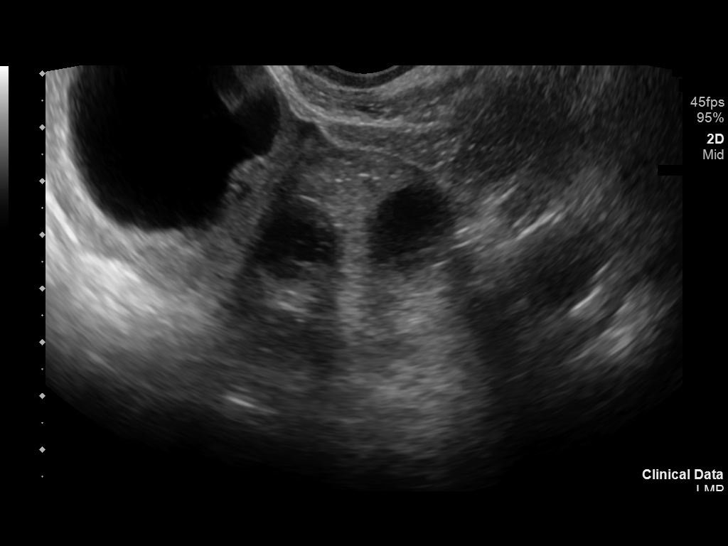
[im 117/128]
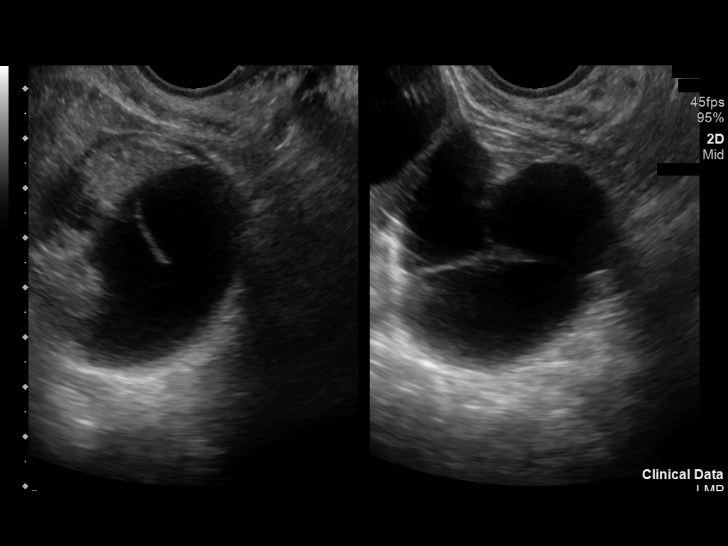
[im 128/128]
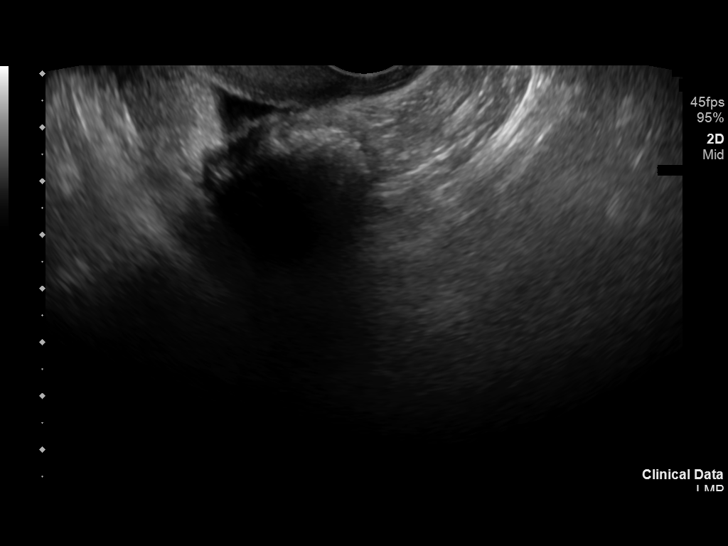

[13 of 25 positions shown; findings below may reference images not displayed]

FINDINGS: Uterus

Measurements: 7.9 x 3.9 x 5.0 cm = volume: 79.3 mL. No fibroids or
other mass visualized.

Endometrium

Thickness: 5 mm.  No focal abnormality visualized.

Right ovary

Measurements: 5.3 x 4.3 x 4.6 cm = volume: 55.2 mL. Simple appearing
cyst measuring 4 x 3.5 x 3.8 cm.

Left ovary

Measurements: 5.1 x 5.0 x 5.0 cm = volume: 67 mL. Complex cyst
measuring 4.4 x 2.8 x 3.5 cm with several septations.

Pulsed Doppler evaluation of both ovaries demonstrates normal
low-resistance arterial and venous waveforms.

Other findings

Small amount pelvic free fluid.
IMPRESSION: 1. Bilateral ovarian cysts, on the right simple appearing measuring
4 cm in size and on the left complex with several septations
measuring 4.4 cm. Ovaries lie in close proximity above the uterus
near the midline with the combination of the 2 ovaries and there are
cysts accounting for the bilobed appearing mass noted on CT. A
similar appearance was present on the prior OB ultrasound from
07/15/2020. Given the complex nature of cyst on the left, follow-up
ultrasound in 2 months is recommended.
2. No evidence of ovarian torsion.
3. Small amount of pelvic free fluid.
4. Normal uterus and endometrium.

## 2021-07-18 IMAGING — CT CT RENAL STONE PROTOCOL
2 of 4 series · 16 of 46 positions shown, 18 images · non-contrast
Comparison: None

CLINICAL DATA: Flank pain, hematuria, kidney stone; history of
ectopic pregnancy in June 2020

EXAM:
CT ABDOMEN AND PELVIS WITHOUT CONTRAST
TECHNIQUE: Multidetector CT imaging of the abdomen and pelvis was performed
following the standard protocol without IV contrast. Sagittal and
coronal MPR images reconstructed from axial data set. No oral
contrast administered.

[Series 2: axial st · axial · 0.86mm/px · z∈[-485,-75]mm · 13 of 94 slices shown, 15 images]
[im 6/94  soft-tissue]
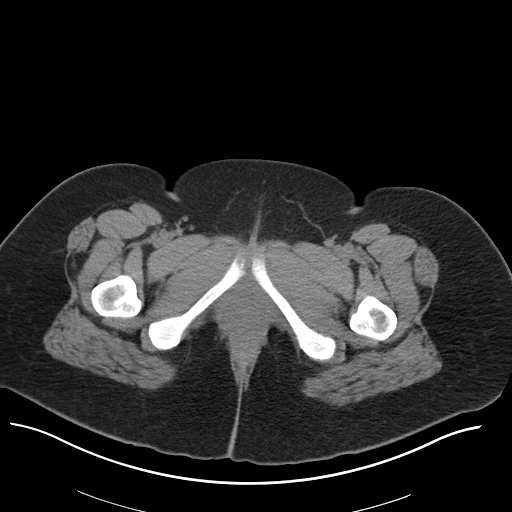
[im 6/94  bone]
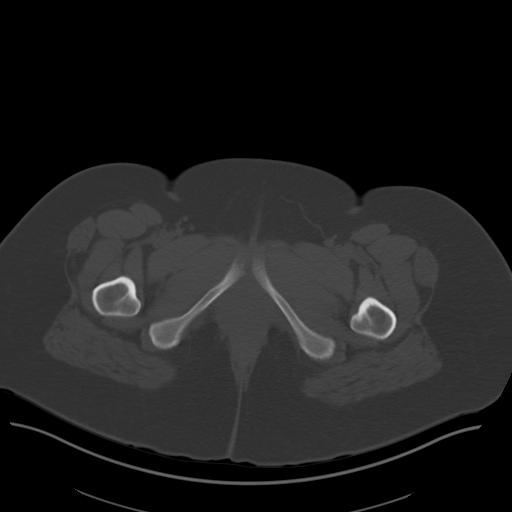
[im 12/94  soft-tissue]
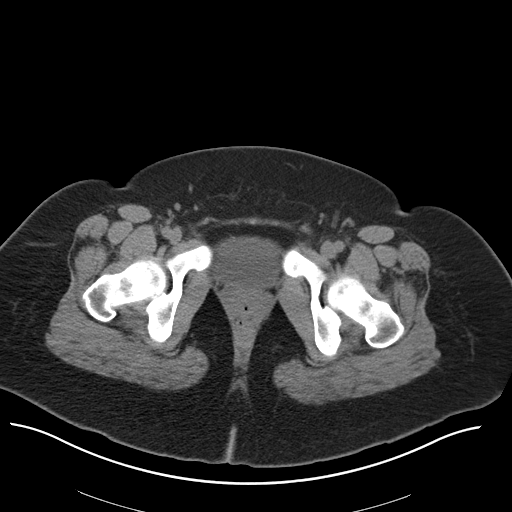
[im 18/94  soft-tissue]
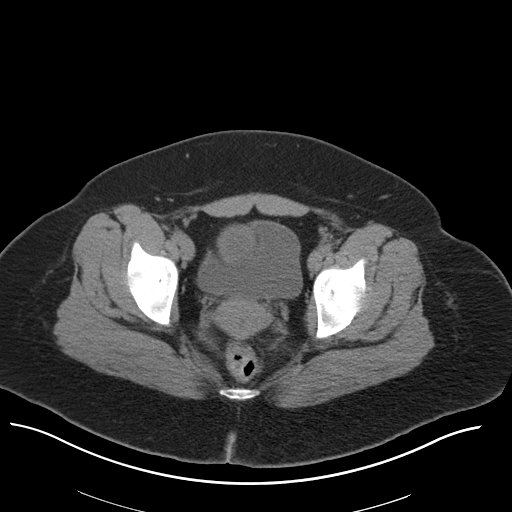
[im 30/94  soft-tissue]
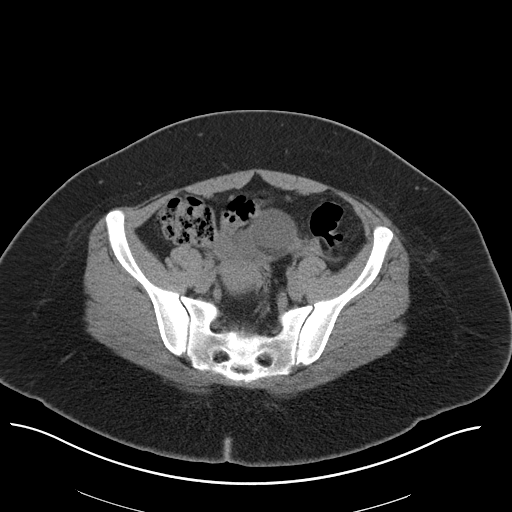
[im 35/94  soft-tissue]
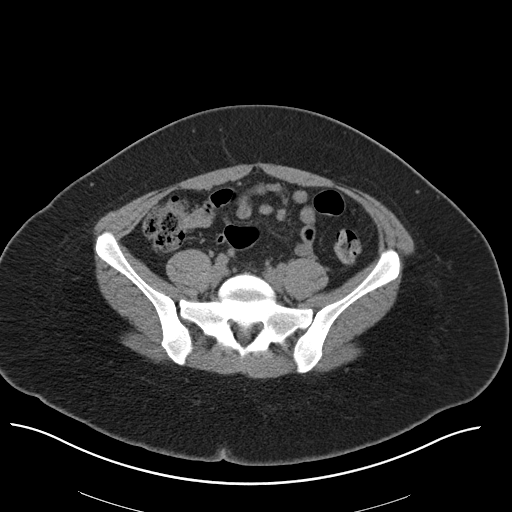
[im 41/94  soft-tissue]
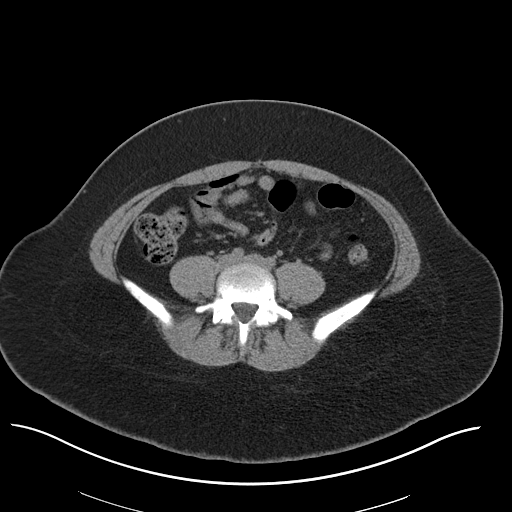
[im 47/94  soft-tissue]
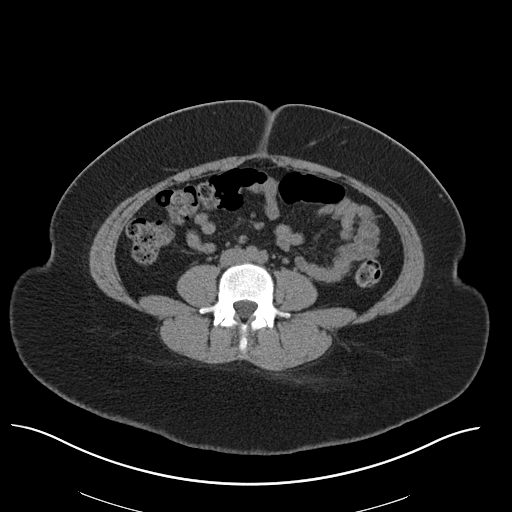
[im 53/94  soft-tissue]
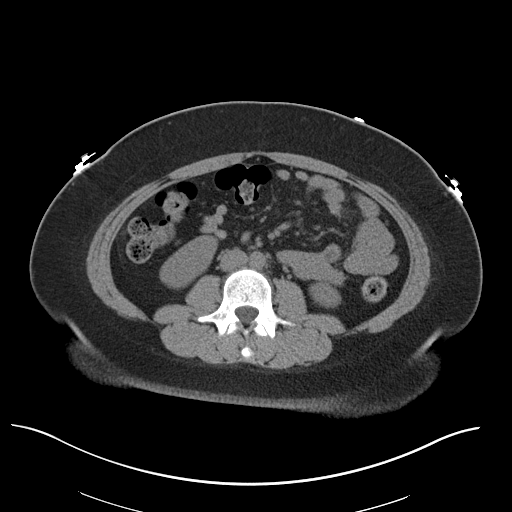
[im 59/94  soft-tissue]
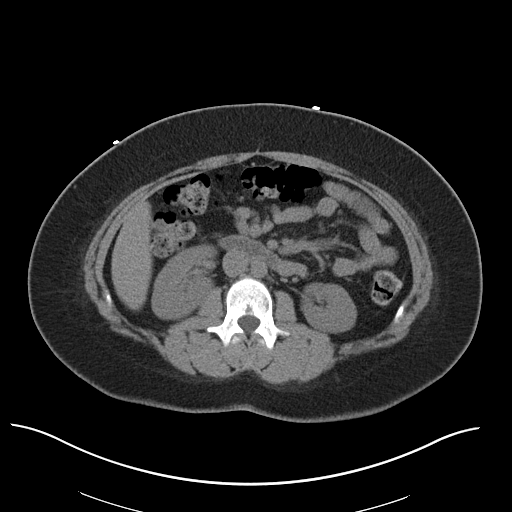
[im 59/94  bone]
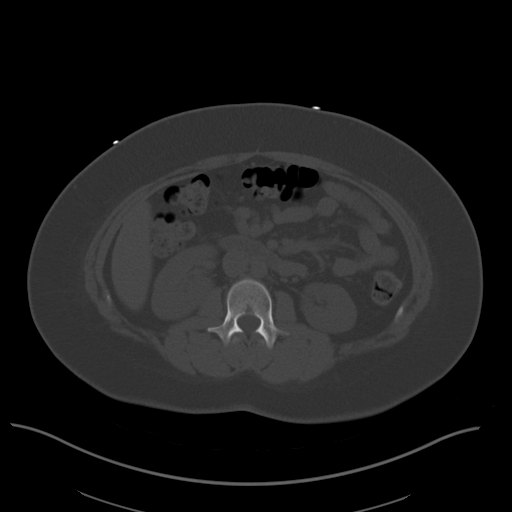
[im 64/94  soft-tissue]
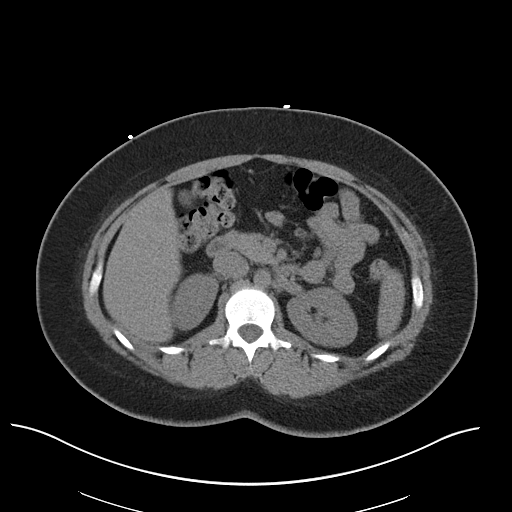
[im 76/94  soft-tissue]
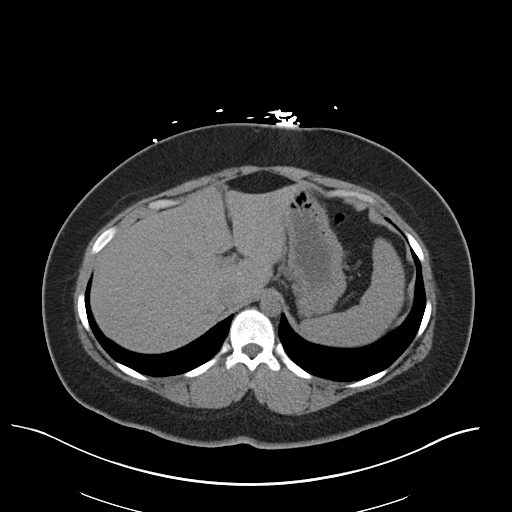
[im 82/94  soft-tissue]
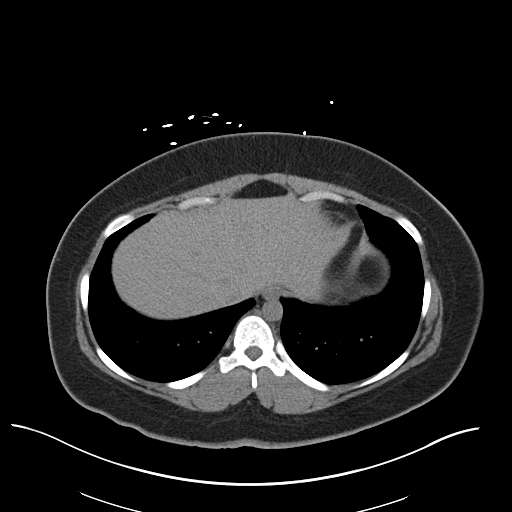
[im 88/94  soft-tissue]
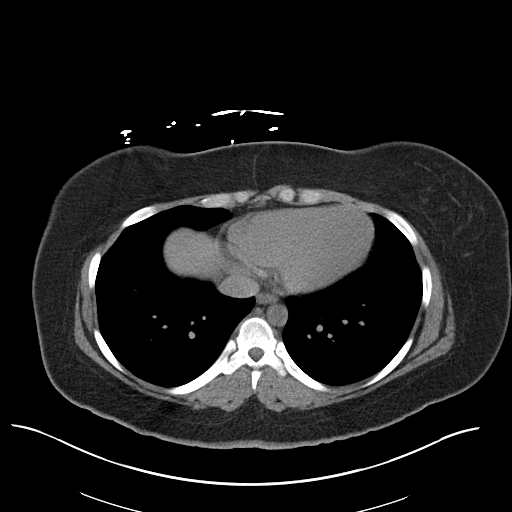

[Series 4: coronal · coronal · 0.90mm/px · 3 of 160 slices shown]
[im 54/160  soft-tissue]
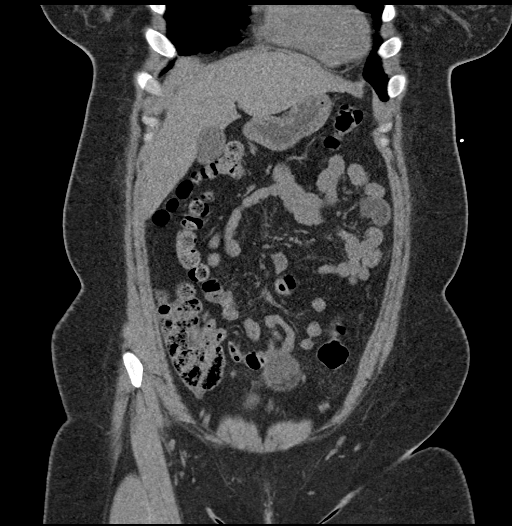
[im 71/160  soft-tissue]
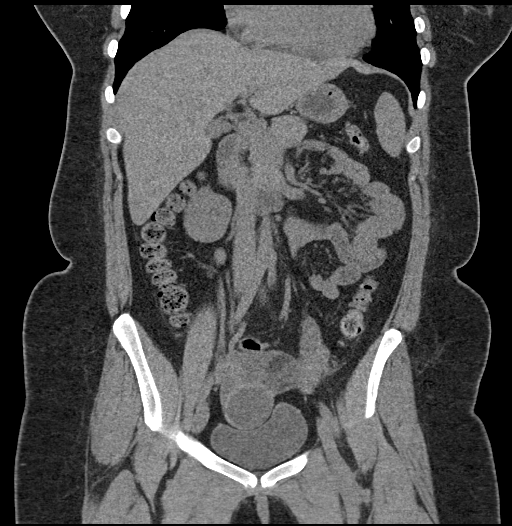
[im 89/160  soft-tissue]
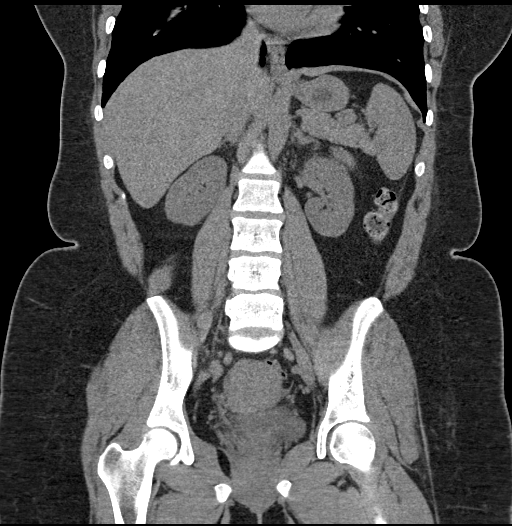

[16 of 46 positions shown; findings below may reference images not displayed]

FINDINGS: Lower chest: Lung bases clear

Hepatobiliary: Gallbladder and liver normal appearance

Pancreas: Normal appearance

Spleen: Normal appearance

Adrenals/Urinary Tract: Adrenal glands, kidneys, ureters, and
bladder normal appearance. No urinary tract calcification or
dilatation.

Stomach/Bowel: Normal appendix. Stomach and bowel loops normal
appearance

Vascular/Lymphatic: Aorta normal caliber.  No adenopathy.

Reproductive: Uterus unremarkable. Large bilobed mass anterior to
the upper uterus measuring 8.0 x 4.8 x 7.0 cm, containing fat,, soft
tissue attenuation, fluid attenuation, and a small calcification
consistent with a dermoid tumor. This is adjacent to both ovaries
but appears more associated with the RIGHT ovary than LEFT favor
RIGHT ovarian origin.

Other: Small amount of low-attenuation free fluid in pelvis. No free
air. No hernia.

Musculoskeletal: Osseous structures unremarkable.
IMPRESSION: Large bilobed mass anterior to the upper uterus measuring 8.0 x
x 7.0 cm, containing fat, soft tissue attenuation, and a small
calcification consistent with a dermoid tumor.

This is adjacent to both ovaries but appears more associated with
the RIGHT ovary than LEFT favor RIGHT ovarian origin.

If there is clinical concern for ovarian torsion, recommend pelvic
sonography with dedicated doppler assessment.

Small amount of nonspecific free fluid in pelvis.

No urinary tract calcification or dilatation.

Findings called to Julethsy Araujo Mora PA on 10/23/2020 at 7802 hours.

## 2021-10-29 ENCOUNTER — Encounter (HOSPITAL_BASED_OUTPATIENT_CLINIC_OR_DEPARTMENT_OTHER): Payer: Self-pay | Admitting: Obstetrics & Gynecology

## 2021-10-29 ENCOUNTER — Other Ambulatory Visit (HOSPITAL_BASED_OUTPATIENT_CLINIC_OR_DEPARTMENT_OTHER): Payer: Self-pay | Admitting: Obstetrics & Gynecology

## 2021-10-29 DIAGNOSIS — Z3041 Encounter for surveillance of contraceptive pills: Secondary | ICD-10-CM

## 2021-10-29 DIAGNOSIS — N926 Irregular menstruation, unspecified: Secondary | ICD-10-CM

## 2021-10-31 ENCOUNTER — Other Ambulatory Visit (HOSPITAL_BASED_OUTPATIENT_CLINIC_OR_DEPARTMENT_OTHER): Payer: Self-pay | Admitting: Obstetrics & Gynecology

## 2022-01-18 ENCOUNTER — Other Ambulatory Visit (HOSPITAL_BASED_OUTPATIENT_CLINIC_OR_DEPARTMENT_OTHER): Payer: Self-pay | Admitting: Obstetrics & Gynecology

## 2022-01-18 DIAGNOSIS — N926 Irregular menstruation, unspecified: Secondary | ICD-10-CM

## 2022-01-18 DIAGNOSIS — Z3041 Encounter for surveillance of contraceptive pills: Secondary | ICD-10-CM

## 2022-01-18 MED ORDER — NORETHIN ACE-ETH ESTRAD-FE 1.5-30 MG-MCG PO TABS: 1.0000 | ORAL_TABLET | Freq: Every day | ORAL | 0 refills | Status: DC

## 2022-03-07 ENCOUNTER — Encounter (HOSPITAL_BASED_OUTPATIENT_CLINIC_OR_DEPARTMENT_OTHER): Payer: Self-pay | Admitting: Obstetrics & Gynecology

## 2022-03-19 ENCOUNTER — Encounter (HOSPITAL_COMMUNITY): Payer: Self-pay | Admitting: Emergency Medicine

## 2022-03-19 ENCOUNTER — Emergency Department (HOSPITAL_COMMUNITY)
Admission: EM | Admit: 2022-03-19 | Discharge: 2022-03-19 | Disposition: A | Discharge status: Home / Self Care | Payer: Medicaid Other | Attending: Emergency Medicine | Admitting: Emergency Medicine

## 2022-03-19 ENCOUNTER — Emergency Department (HOSPITAL_COMMUNITY): Payer: Medicaid Other

## 2022-03-19 DIAGNOSIS — S299XXA Unspecified injury of thorax, initial encounter: Secondary | ICD-10-CM | POA: Diagnosis present

## 2022-03-19 DIAGNOSIS — S20211A Contusion of right front wall of thorax, initial encounter: Secondary | ICD-10-CM | POA: Diagnosis not present

## 2022-03-19 DIAGNOSIS — Y92481 Parking lot as the place of occurrence of the external cause: Secondary | ICD-10-CM | POA: Diagnosis not present

## 2022-03-19 DIAGNOSIS — Y92410 Unspecified street and highway as the place of occurrence of the external cause: Secondary | ICD-10-CM | POA: Diagnosis not present

## 2022-03-19 LAB — POC URINE PREG, ED: Preg Test, Ur: NEGATIVE

## 2022-03-19 LAB — TROPONIN I (HIGH SENSITIVITY): Troponin I (High Sensitivity): <2 ng/L (ref <18)

## 2022-03-19 MED ORDER — METHOCARBAMOL 500 MG PO TABS: 500.0000 mg | ORAL_TABLET | Freq: Two times a day (BID) | ORAL | 0 refills | Status: AC

## 2022-03-19 MED ORDER — ACETAMINOPHEN 325 MG PO TABS
650.0000 mg | ORAL_TABLET | Freq: Once | ORAL | Status: AC
  Administered 2022-03-19: 650 mg via ORAL
  Filled 2022-03-19: qty 2

## 2022-04-03 ENCOUNTER — Other Ambulatory Visit (HOSPITAL_BASED_OUTPATIENT_CLINIC_OR_DEPARTMENT_OTHER): Payer: Self-pay | Admitting: Obstetrics & Gynecology

## 2022-04-03 DIAGNOSIS — N926 Irregular menstruation, unspecified: Secondary | ICD-10-CM

## 2022-04-03 DIAGNOSIS — Z3041 Encounter for surveillance of contraceptive pills: Secondary | ICD-10-CM

## 2022-04-04 MED ORDER — NORETHIN ACE-ETH ESTRAD-FE 1.5-30 MG-MCG PO TABS: 1.0000 | ORAL_TABLET | Freq: Every day | ORAL | 0 refills | Status: AC

## 2022-06-22 ENCOUNTER — Other Ambulatory Visit (HOSPITAL_BASED_OUTPATIENT_CLINIC_OR_DEPARTMENT_OTHER): Payer: Self-pay | Admitting: Obstetrics & Gynecology

## 2022-06-22 DIAGNOSIS — N926 Irregular menstruation, unspecified: Secondary | ICD-10-CM

## 2022-06-22 DIAGNOSIS — Z3041 Encounter for surveillance of contraceptive pills: Secondary | ICD-10-CM

## 2022-06-28 NOTE — Telephone Encounter (Signed)
LMOVM for pt to call regarding refill request 

## 2022-07-05 ENCOUNTER — Ambulatory Visit (HOSPITAL_BASED_OUTPATIENT_CLINIC_OR_DEPARTMENT_OTHER): Payer: Medicaid Other | Admitting: Obstetrics & Gynecology
